# Patient Record
Sex: Female | Born: 1985 | Race: Black or African American | Hispanic: No | Marital: Single | Smoking: Current every day smoker
Health system: Southern US, Community
[De-identification: ages and names within clinical notes are randomized; demographics above are authoritative.]

## PROBLEM LIST (undated history)

## (undated) DIAGNOSIS — O009 Unspecified ectopic pregnancy without intrauterine pregnancy: Secondary | ICD-10-CM

## (undated) HISTORY — PX: NO PAST SURGERIES: SHX2092

## (undated) HISTORY — PX: ECTOPIC PREGNANCY SURGERY: SHX613

---
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
## 2005-03-07 ENCOUNTER — Ambulatory Visit: Payer: Self-pay | Admitting: Nurse Practitioner

## 2005-05-09 ENCOUNTER — Emergency Department (HOSPITAL_COMMUNITY): Admission: EM | Admit: 2005-05-09 | Discharge: 2005-05-09 | Payer: Self-pay | Admitting: Emergency Medicine

## 2005-05-22 ENCOUNTER — Ambulatory Visit: Payer: Self-pay | Admitting: Nurse Practitioner

## 2005-06-29 ENCOUNTER — Emergency Department (HOSPITAL_COMMUNITY): Admission: EM | Admit: 2005-06-29 | Discharge: 2005-06-29 | Payer: Self-pay | Admitting: Emergency Medicine

## 2006-06-18 ENCOUNTER — Emergency Department (HOSPITAL_COMMUNITY): Admission: EM | Admit: 2006-06-18 | Discharge: 2006-06-18 | Payer: Self-pay | Admitting: Emergency Medicine

## 2006-09-11 DIAGNOSIS — D649 Anemia, unspecified: Secondary | ICD-10-CM | POA: Insufficient documentation

## 2006-09-11 DIAGNOSIS — J45909 Unspecified asthma, uncomplicated: Secondary | ICD-10-CM | POA: Insufficient documentation

## 2006-09-11 DIAGNOSIS — D509 Iron deficiency anemia, unspecified: Secondary | ICD-10-CM | POA: Insufficient documentation

## 2007-08-19 ENCOUNTER — Emergency Department (HOSPITAL_COMMUNITY): Admission: EM | Admit: 2007-08-19 | Discharge: 2007-08-19 | Payer: Self-pay | Admitting: Emergency Medicine

## 2007-08-22 ENCOUNTER — Emergency Department (HOSPITAL_COMMUNITY): Admission: EM | Admit: 2007-08-22 | Discharge: 2007-08-22 | Payer: Self-pay | Admitting: Emergency Medicine

## 2007-09-19 ENCOUNTER — Inpatient Hospital Stay (HOSPITAL_COMMUNITY): Admission: AD | Admit: 2007-09-19 | Discharge: 2007-09-19 | Payer: Self-pay | Admitting: Obstetrics & Gynecology

## 2008-04-25 ENCOUNTER — Inpatient Hospital Stay (HOSPITAL_COMMUNITY): Admission: AD | Admit: 2008-04-25 | Discharge: 2008-04-27 | Payer: Self-pay | Admitting: Obstetrics

## 2009-04-08 ENCOUNTER — Emergency Department (HOSPITAL_COMMUNITY): Admission: EM | Admit: 2009-04-08 | Discharge: 2009-04-08 | Payer: Self-pay | Admitting: Emergency Medicine

## 2009-11-08 ENCOUNTER — Emergency Department (HOSPITAL_COMMUNITY): Admission: EM | Admit: 2009-11-08 | Discharge: 2009-11-08 | Payer: Self-pay | Admitting: Emergency Medicine

## 2010-02-13 ENCOUNTER — Encounter: Payer: Self-pay | Admitting: Obstetrics & Gynecology

## 2010-04-17 LAB — URINE MICROSCOPIC-ADD ON

## 2010-04-17 LAB — URINE CULTURE

## 2010-04-17 LAB — URINALYSIS, ROUTINE W REFLEX MICROSCOPIC
Glucose, UA: NEGATIVE mg/dL
Hgb urine dipstick: NEGATIVE
Ketones, ur: 15 mg/dL — AB
Nitrite: NEGATIVE
Protein, ur: NEGATIVE mg/dL
Specific Gravity, Urine: 1.024 (ref 1.005–1.030)
Urobilinogen, UA: 1 mg/dL (ref 0.0–1.0)
pH: 6.5 (ref 5.0–8.0)

## 2010-04-17 LAB — HEMOCCULT GUIAC POC 1CARD (OFFICE): Fecal Occult Bld: POSITIVE

## 2010-04-17 LAB — POCT PREGNANCY, URINE: Preg Test, Ur: NEGATIVE

## 2010-05-04 LAB — CBC
HCT: 25.8 % — ABNORMAL LOW (ref 36.0–46.0)
HCT: 30.6 % — ABNORMAL LOW (ref 36.0–46.0)
Hemoglobin: 8.3 g/dL — ABNORMAL LOW (ref 12.0–15.0)
Hemoglobin: 9.8 g/dL — ABNORMAL LOW (ref 12.0–15.0)
MCHC: 32 g/dL (ref 30.0–36.0)
MCHC: 32.1 g/dL (ref 30.0–36.0)
MCV: 79.1 fL (ref 78.0–100.0)
MCV: 79.8 fL (ref 78.0–100.0)
Platelets: 214 10*3/uL (ref 150–400)
Platelets: 255 10*3/uL (ref 150–400)
RBC: 3.26 MIL/uL — ABNORMAL LOW (ref 3.87–5.11)
RBC: 3.84 MIL/uL — ABNORMAL LOW (ref 3.87–5.11)
RDW: 13.4 % (ref 11.5–15.5)
RDW: 14 % (ref 11.5–15.5)
WBC: 11.9 10*3/uL — ABNORMAL HIGH (ref 4.0–10.5)
WBC: 9.6 10*3/uL (ref 4.0–10.5)

## 2010-05-04 LAB — RPR: RPR Ser Ql: NONREACTIVE

## 2010-10-21 LAB — URINALYSIS, ROUTINE W REFLEX MICROSCOPIC
Bilirubin Urine: NEGATIVE
Glucose, UA: NEGATIVE
Hgb urine dipstick: NEGATIVE
Ketones, ur: NEGATIVE
Nitrite: NEGATIVE
Protein, ur: NEGATIVE
Specific Gravity, Urine: 1.023
Urobilinogen, UA: 0.2
pH: 6.5

## 2010-10-21 LAB — RPR: RPR Ser Ql: NONREACTIVE

## 2010-10-21 LAB — WET PREP, GENITAL
Clue Cells Wet Prep HPF POC: NONE SEEN
Trich, Wet Prep: NONE SEEN
WBC, Wet Prep HPF POC: NONE SEEN

## 2010-10-21 LAB — HCG, QUANTITATIVE, PREGNANCY
hCG, Beta Chain, Quant, S: 1451 — ABNORMAL HIGH
hCG, Beta Chain, Quant, S: 4637 — ABNORMAL HIGH

## 2010-10-21 LAB — GC/CHLAMYDIA PROBE AMP, GENITAL
Chlamydia, DNA Probe: NEGATIVE
GC Probe Amp, Genital: NEGATIVE

## 2010-10-21 LAB — POCT PREGNANCY, URINE
Operator id: 272551
Preg Test, Ur: POSITIVE

## 2011-04-11 ENCOUNTER — Encounter (HOSPITAL_COMMUNITY): Payer: Self-pay | Admitting: *Deleted

## 2011-04-11 ENCOUNTER — Emergency Department (INDEPENDENT_AMBULATORY_CARE_PROVIDER_SITE_OTHER)
Admission: EM | Admit: 2011-04-11 | Discharge: 2011-04-11 | Disposition: A | Discharge status: Nursing Home (Includes ICF) | Payer: Self-pay | Source: Home / Self Care | Attending: Family Medicine | Admitting: Family Medicine

## 2011-04-11 DIAGNOSIS — N949 Unspecified condition associated with female genital organs and menstrual cycle: Secondary | ICD-10-CM

## 2011-04-11 DIAGNOSIS — N926 Irregular menstruation, unspecified: Secondary | ICD-10-CM

## 2011-04-11 DIAGNOSIS — N938 Other specified abnormal uterine and vaginal bleeding: Secondary | ICD-10-CM

## 2011-04-11 LAB — POCT PREGNANCY, URINE: Preg Test, Ur: NEGATIVE

## 2011-04-11 NOTE — ED Provider Notes (Signed)
History     CSN: [id number]  Arrival date & time 04/11/11  0848   First MD Initiated Contact with Patient 04/11/11 [phone]      Chief Complaint  Patient presents with  . Vaginal Bleeding    (Consider location/radiation/quality/duration/timing/severity/associated sxs/prior treatment) Patient is a 26 y.o. female presenting with vaginal bleeding. The history is provided by the patient.  Vaginal Bleeding This is a new problem. The current episode started more than 1 week ago (heavy vag bleeding over past 3-4 weeks, no h/o irreg bleeding, now changing tampons q2h., having cramping like menses.). The problem occurs constantly. The problem has been gradually worsening.    Past Medical History  Diagnosis Date  . Asthma     History reviewed. No pertinent past surgical history.  Family History  Problem Relation Age of Onset  . Heart murmur Mother   . Mental illness Mother   . Heart murmur Brother     History  Substance Use Topics  . Smoking status: Current Everyday Smoker -- 0.5 packs/day for 5 years    Types: Cigarettes  . Smokeless tobacco: Not on file  . Alcohol Use: No    OB History    Grav Para Term Preterm Abortions TAB SAB Ect Mult Living                  Review of Systems  Constitutional: Negative.   Gastrointestinal: Negative.   Genitourinary: Positive for vaginal bleeding and pelvic pain. Negative for dysuria, frequency and vaginal discharge.    Allergies  Review of patient's allergies indicates no known allergies.  Home Medications  No current outpatient prescriptions on file.  BP 108/66  Pulse 66  Temp(Src) 98.1 F (36.7 C) (Oral)  Resp 16  SpO2 100%  LMP 04/08/2011  Physical Exam  Nursing note and vitals reviewed. Constitutional: She is oriented to person, place, and time. She appears well-developed and well-nourished.  Abdominal: Soft. Bowel sounds are normal. She exhibits no distension and no mass. There is tenderness in the suprapubic area.  There is no rigidity, no rebound and no guarding.  Neurological: She is alert and oriented to person, place, and time.  Skin: Skin is warm and dry.    ED Course  Procedures (including critical care time)   Labs Reviewed  POCT PREGNANCY, URINE   No results found.   1. Dysfunctional uterine bleeding       MDM  [id number]-neg        Linna Hoff, MD 04/11/11 1038

## 2011-04-11 NOTE — ED Notes (Signed)
Pt reports about 3 weeks ago she was having vaginal itching and discharge.  She used OTC Monistat 1 with good results.  A week later she had a heavy, 10 day period with cramping.  5  days later she started having heavy vaginal bleeding again, which was 3 days ago   She took an OTC pregnancy test recently and it was negative

## 2011-04-11 NOTE — ED Notes (Signed)
Rosalita Chessman, NP at [facility] called and informed pt is on her way there for further care

## 2011-04-11 NOTE — Discharge Instructions (Signed)
Go directly to [facility] for further eval of vaginal bleeding.

## 2011-04-12 ENCOUNTER — Inpatient Hospital Stay (HOSPITAL_COMMUNITY)
Admission: AD | Admit: 2011-04-12 | Discharge: 2011-04-12 | Disposition: A | Discharge status: Home / Self Care | Payer: Self-pay | Source: Ambulatory Visit | Attending: Obstetrics | Admitting: Obstetrics

## 2011-04-12 ENCOUNTER — Encounter (HOSPITAL_COMMUNITY): Payer: Self-pay | Admitting: *Deleted

## 2011-04-12 DIAGNOSIS — N949 Unspecified condition associated with female genital organs and menstrual cycle: Secondary | ICD-10-CM | POA: Insufficient documentation

## 2011-04-12 DIAGNOSIS — N938 Other specified abnormal uterine and vaginal bleeding: Secondary | ICD-10-CM | POA: Insufficient documentation

## 2011-04-12 LAB — URINALYSIS, ROUTINE W REFLEX MICROSCOPIC
Glucose, UA: NEGATIVE mg/dL
Leukocytes, UA: NEGATIVE
Protein, ur: NEGATIVE mg/dL
pH: 7 (ref 5.0–8.0)

## 2011-04-12 LAB — CBC
HCT: 28.5 % — ABNORMAL LOW (ref 36.0–46.0)
Hemoglobin: 9.1 g/dL — ABNORMAL LOW (ref 12.0–15.0)
MCHC: 31.9 g/dL (ref 30.0–36.0)
RDW: 14 % (ref 11.5–15.5)
WBC: 4.1 10*3/uL (ref 4.0–10.5)

## 2011-04-12 LAB — WET PREP, GENITAL: Clue Cells Wet Prep HPF POC: NONE SEEN

## 2011-04-12 MED ORDER — POLYETHYLENE GLYCOL 3350 17 GM/SCOOP PO POWD: 17.0000 g | Freq: Every day | ORAL | Status: AC

## 2011-04-12 MED ORDER — MEGESTROL ACETATE 40 MG PO TABS: 40.0000 mg | ORAL_TABLET | Freq: Every day | ORAL | Status: AC

## 2011-04-12 NOTE — MAU Note (Signed)
Tampon removed after triage/ medium bleeding, pad placed January menses x 5 days.

## 2011-04-12 NOTE — Discharge Instructions (Signed)
Menorrhagia   Menorrhagia is when a menstrual period is heavier or longer than normal.  HOME CARE   Only take medicine as told by your doctor.   Do not take aspirin 1 week before or during your period. Aspirin can make the bleeding worse.   Lay down for a while if you change your tampon or pad more than once in 2 hours. This may help lessen the bleeding.   Take any iron pills as told by your doctor. Heavy bleeding may cause you to lack iron in your body.   Eat a healthy diet and foods with iron. These foods include leafy green vegetables, meat, liver, eggs, and whole grain breads and cereals.   Eat foods that are high in vitamin C. These include oranges, orange juice, and grapefruits. Vitamin C can help your body take in more iron.   Do not try to lose weight. Wait until the heavy bleeding has stopped and your iron level is normal.  GET HELP RIGHT AWAY IF:   You get a fever.   You have trouble breathing.   You bleed even more heavily than usual and pass blood clots.   You feel dizzy, weak, or pass out (faint).   You need to change your tampon or pad more than once an hour.   You feel sick to your stomach (nauseous), throw up (vomit), or have watery poop (diarrhea).   You have problems from medicine.  MAKE SURE YOU:    Understand these instructions.   Will watch your condition.   Will get help right away if you are not doing well or get worse.  Document Released: 10/19/2007 Document Revised: 12/29/2010 Document Reviewed: 10/19/2007  [employer] Patient Information 2012 [employer].

## 2011-04-12 NOTE — MAU Provider Note (Signed)
  History     CSN: [id number]  Arrival date and time: 04/12/11 0846   None     Chief Complaint  Patient presents with  . Vaginal Bleeding   Vaginal Bleeding The patient's primary symptoms include genital itching and vaginal bleeding. The current episode started more than 1 month ago. The problem occurs constantly. The problem has been unchanged. The pain is mild. She is not pregnant. Associated symptoms include constipation. Pertinent negatives include no back pain, dysuria, fever, headaches or rash. Associated symptoms comments: Constipation, vaginal itching. . The vaginal bleeding is heavier than menses. She has not been passing clots. She has not been passing tissue. The symptoms are aggravated by nothing. She has tried nothing for the symptoms. She is sexually active. No, her partner does not have an STD. She uses nothing for contraception. Her menstrual history has been regular.    OB History    Grav Para Term Preterm Abortions TAB SAB Ect Mult Living                   Past Medical History  Diagnosis Date  . Asthma     Past Surgical History  Procedure Date  . No past surgeries     Family History  Problem Relation Age of Onset  . Heart murmur Mother   . Mental illness Mother   . Heart murmur Brother     History  Substance Use Topics  . Smoking status: Current Everyday Smoker -- 0.5 packs/day for 5 years    Types: Cigarettes  . Smokeless tobacco: Not on file  . Alcohol Use: No    Allergies:  Allergies  Allergen Reactions  . Latex Hives    No prescriptions prior to admission    Review of Systems  Constitutional: Negative for fever.  Eyes: Negative for blurred vision.  Respiratory: Negative for shortness of breath.   Cardiovascular: Negative for chest pain.  Gastrointestinal: Positive for constipation.  Genitourinary: Positive for vaginal bleeding. Negative for dysuria.  Musculoskeletal: Negative for back pain.  Skin: Negative for rash.  Neurological:  Negative for dizziness, weakness and headaches.   Physical Exam   Blood pressure 128/75, pulse 62, temperature 97.2 F (36.2 C), resp. rate 18, height 5\' 7"  (1.702 m), weight 92.987 kg (205 lb), last menstrual period 03/21/2011.  Physical Exam  Constitutional: She appears well-developed and well-nourished. No distress.  HENT:  Head: Normocephalic and atraumatic.  Eyes: EOM are normal. Pupils are equal, round, and reactive to light.  Neck: Neck supple.  Cardiovascular: Normal rate, regular rhythm and normal heart sounds.   Respiratory: Effort normal and breath sounds normal.  GI: Soft. Bowel sounds are normal. There is no tenderness.  Genitourinary: Vagina normal and uterus normal. Right adnexum displays no mass. Left adnexum displays no mass.       Blood in os and vaginal vault.     MAU Course  Procedures  MDM Patient with bleeding x 1 month, will check for GC/Chlamydia.  Wet prep negative, hemoglobin at baseline.    Assessment and Plan  26 y.o. female who presents with DUB  Gail Weber 04/12/2011, 10:08 AM

## 2011-04-12 NOTE — MAU Note (Addendum)
Bleeding started 2/26/ 13, stopped 1 week ago, then started again after 2 days. Tampons 1 per hour since 4 days ago. No birth control pills,  Del 2010, no birth control since, had NUVA ring after this child, she does not remember removing 2nd cycle, Dr. Gaynell Face examed her and did not find.Normal period in January Negative UPT yesterday at Urgent Care

## 2011-04-13 LAB — GC/CHLAMYDIA PROBE AMP, GENITAL: Chlamydia, DNA Probe: NEGATIVE

## 2011-12-11 ENCOUNTER — Emergency Department (HOSPITAL_COMMUNITY): Payer: Self-pay

## 2011-12-11 ENCOUNTER — Encounter (HOSPITAL_COMMUNITY): Payer: Self-pay | Admitting: Emergency Medicine

## 2011-12-11 ENCOUNTER — Emergency Department (HOSPITAL_COMMUNITY)
Admission: EM | Admit: 2011-12-11 | Discharge: 2011-12-11 | Disposition: A | Discharge status: Home / Self Care | Payer: Self-pay | Attending: Emergency Medicine | Admitting: Emergency Medicine

## 2011-12-11 DIAGNOSIS — F17200 Nicotine dependence, unspecified, uncomplicated: Secondary | ICD-10-CM | POA: Insufficient documentation

## 2011-12-11 DIAGNOSIS — Y929 Unspecified place or not applicable: Secondary | ICD-10-CM | POA: Insufficient documentation

## 2011-12-11 DIAGNOSIS — S40012A Contusion of left shoulder, initial encounter: Secondary | ICD-10-CM

## 2011-12-11 DIAGNOSIS — IMO0002 Reserved for concepts with insufficient information to code with codable children: Secondary | ICD-10-CM | POA: Insufficient documentation

## 2011-12-11 DIAGNOSIS — J45909 Unspecified asthma, uncomplicated: Secondary | ICD-10-CM | POA: Insufficient documentation

## 2011-12-11 DIAGNOSIS — S40019A Contusion of unspecified shoulder, initial encounter: Secondary | ICD-10-CM | POA: Insufficient documentation

## 2011-12-11 DIAGNOSIS — Y939 Activity, unspecified: Secondary | ICD-10-CM | POA: Insufficient documentation

## 2011-12-11 MED ORDER — IBUPROFEN 400 MG PO TABS
600.0000 mg | ORAL_TABLET | Freq: Once | ORAL | Status: DC
  Filled 2011-12-11: qty 1

## 2011-12-11 MED ORDER — TRAMADOL HCL 50 MG PO TABS: 50.0000 mg | ORAL_TABLET | Freq: Four times a day (QID) | ORAL | Status: DC | PRN

## 2011-12-11 MED ORDER — TRAMADOL HCL 50 MG PO TABS
50.0000 mg | ORAL_TABLET | Freq: Once | ORAL | Status: AC
  Administered 2011-12-11: 50 mg via ORAL
  Filled 2011-12-11: qty 1

## 2011-12-11 NOTE — Progress Notes (Signed)
Orthopedic Tech Progress Note Patient Details:  Gail Weber [birth year] [id number]  Ortho Devices Type of Ortho Device: Arm foam sling Ortho Device/Splint Location: (L) UE Ortho Device/Splint Interventions: Application   Jennye Moccasin 12/11/2011, 5:20 PM
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
## 2011-12-11 NOTE — ED Notes (Signed)
NAD noted at time of d/c home 
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
## 2012-02-05 ENCOUNTER — Emergency Department (HOSPITAL_COMMUNITY)
Admission: EM | Admit: 2012-02-05 | Discharge: 2012-02-06 | Disposition: A | Discharge status: Home / Self Care | Payer: Self-pay | Attending: Emergency Medicine | Admitting: Emergency Medicine

## 2012-02-05 DIAGNOSIS — J45909 Unspecified asthma, uncomplicated: Secondary | ICD-10-CM | POA: Insufficient documentation

## 2012-02-05 DIAGNOSIS — Z79899 Other long term (current) drug therapy: Secondary | ICD-10-CM | POA: Insufficient documentation

## 2012-02-05 DIAGNOSIS — F17200 Nicotine dependence, unspecified, uncomplicated: Secondary | ICD-10-CM | POA: Insufficient documentation

## 2012-02-05 DIAGNOSIS — R197 Diarrhea, unspecified: Secondary | ICD-10-CM

## 2012-02-05 DIAGNOSIS — R112 Nausea with vomiting, unspecified: Secondary | ICD-10-CM

## 2012-02-05 DIAGNOSIS — K529 Noninfective gastroenteritis and colitis, unspecified: Secondary | ICD-10-CM

## 2012-02-05 DIAGNOSIS — K5289 Other specified noninfective gastroenteritis and colitis: Secondary | ICD-10-CM | POA: Insufficient documentation

## 2012-02-05 DIAGNOSIS — Z3202 Encounter for pregnancy test, result negative: Secondary | ICD-10-CM | POA: Insufficient documentation

## 2012-02-05 MED ORDER — ONDANSETRON HCL 4 MG/2ML IJ SOLN
4.0000 mg | Freq: Once | INTRAMUSCULAR | Status: AC
  Administered 2012-02-05: 4 mg via INTRAVENOUS
  Filled 2012-02-05: qty 2

## 2012-02-05 MED ORDER — SODIUM CHLORIDE 0.9 % IV SOLN
Freq: Once | INTRAVENOUS | Status: AC
  Administered 2012-02-05: 23:00:00 via INTRAVENOUS

## 2012-02-05 NOTE — ED Notes (Signed)
Pt was given Zofran IV for c/o nausea and vomiting  Sxs improved  Pt no longer nauseated and states pain is gone

## 2012-02-05 NOTE — ED Notes (Signed)
Per EMS pt is having abd pain with nausea, vomiting, and diarrhea  Sxs started today  Pt lives with someone who has recently been treated for same

## 2012-02-06 LAB — URINALYSIS, ROUTINE W REFLEX MICROSCOPIC
Glucose, UA: NEGATIVE mg/dL
Leukocytes, UA: NEGATIVE
Protein, ur: NEGATIVE mg/dL
Specific Gravity, Urine: 1.025 (ref 1.005–1.030)
Urobilinogen, UA: 0.2 mg/dL (ref 0.0–1.0)

## 2012-02-06 LAB — PREGNANCY, URINE: Preg Test, Ur: NEGATIVE

## 2012-02-06 LAB — URINE MICROSCOPIC-ADD ON

## 2012-02-06 MED ORDER — SODIUM CHLORIDE 0.9 % IV BOLUS (SEPSIS)
1000.0000 mL | Freq: Once | INTRAVENOUS | Status: AC
  Administered 2012-02-06: 1000 mL via INTRAVENOUS

## 2012-02-06 MED ORDER — ONDANSETRON HCL 4 MG PO TABS: 4.0000 mg | ORAL_TABLET | Freq: Four times a day (QID) | ORAL | Status: DC

## 2012-02-06 NOTE — ED Provider Notes (Signed)
History     CSN: [id number]  Arrival date & time 02/05/12  2157   First MD Initiated Contact with Patient 02/05/12 2307      Chief Complaint  Patient presents with  . Abdominal Pain  . Emesis    (Consider location/radiation/quality/duration/timing/severity/associated sxs/prior treatment) HPI 27 year old female presents to emergency room with complaint of nausea vomiting and diarrhea starting today around 5 PM. She has had multiple episodes of vomiting. Patient reports her nephew was in her house yesterday with similar symptoms. She has had no fever. There's been no blood or mucus in her emesis or stool. She complains of diffuse abdominal pain secondary to vomiting.  Past Medical History  Diagnosis Date  . Asthma     Past Surgical History  Procedure Date  . No past surgeries     Family History  Problem Relation Age of Onset  . Heart murmur Mother   . Mental illness Mother   . Heart murmur Brother     History  Substance Use Topics  . Smoking status: Current Every Day Smoker -- 0.5 packs/day for 5 years    Types: Cigarettes  . Smokeless tobacco: Not on file  . Alcohol Use: No    OB History    Grav Para Term Preterm Abortions TAB SAB Ect Mult Living                  Review of Systems  See History of Present Illness; otherwise all other systems are reviewed and negative Allergies  Latex  Home Medications   Current Outpatient Rx  Name  Route  Sig  Dispense  Refill  . ALBUTEROL SULFATE HFA 108 (90 BASE) MCG/ACT IN AERS   Inhalation   Inhale 2 puffs into the lungs every 6 (six) hours as needed. For shortness of breath.         . TRAMADOL HCL 50 MG PO TABS   Oral   Take 1 tablet (50 mg total) by mouth every 6 (six) hours as needed for pain.   15 tablet   0     BP 128/59  Pulse 90  Temp 98.7 F (37.1 C) (Oral)  Resp 18  SpO2 100%  Physical Exam  Nursing note and vitals reviewed. Constitutional: She is oriented to person, place, and time. She  appears well-developed and well-nourished.  HENT:  Head: Normocephalic and atraumatic.  Nose: Nose normal.  Mouth/Throat: No oropharyngeal exudate.       Dry mucous membranes  Eyes: Conjunctivae normal and EOM are normal. Pupils are equal, round, and reactive to light.  Neck: Normal range of motion. Neck supple. No JVD present. No tracheal deviation present. No thyromegaly present.  Cardiovascular: Normal rate, regular rhythm, normal heart sounds and intact distal pulses.  Exam reveals no gallop and no friction rub.   No murmur heard. Pulmonary/Chest: Effort normal and breath sounds normal. No stridor. No respiratory distress. She has no wheezes. She has no rales. She exhibits no tenderness.  Abdominal: Soft. She exhibits no distension and no mass. There is tenderness. There is no rebound and no guarding.       Diffuse of bowel pain, no rebound or guarding. Hyperactive bowel sounds  Musculoskeletal: Normal range of motion. She exhibits no edema and no tenderness.  Lymphadenopathy:    She has no cervical adenopathy.  Neurological: She is alert and oriented to person, place, and time. She exhibits normal muscle tone. Coordination normal.  Skin: Skin is warm and dry. No rash noted.  No erythema. No pallor.  Psychiatric: She has a normal mood and affect. Her behavior is normal. Judgment and thought content normal.    ED Course  Procedures (including critical care time)  Labs Reviewed  URINALYSIS, ROUTINE W REFLEX MICROSCOPIC - Abnormal; Notable for the following:    Hgb urine dipstick SMALL (*)     Ketones, ur >80 (*)     All other components within normal limits  URINE MICROSCOPIC-ADD ON - Abnormal; Notable for the following:    Squamous Epithelial / LPF MANY (*)     All other components within normal limits  PREGNANCY, URINE   No results found.   1. Gastroenteritis   2. Nausea vomiting and diarrhea       MDM  27 year old female with nausea vomiting diarrhea. Suspect viral  gastroenteritis. She has tolerated fluids and crackers.. Will send her home with Zofran          Olivia Mackie, MD 02/06/12 [phone]

## 2012-04-17 ENCOUNTER — Encounter (HOSPITAL_COMMUNITY): Payer: Self-pay | Admitting: Emergency Medicine

## 2012-04-17 ENCOUNTER — Emergency Department (HOSPITAL_COMMUNITY)
Admission: EM | Admit: 2012-04-17 | Discharge: 2012-04-17 | Disposition: A | Discharge status: Home / Self Care | Payer: Self-pay | Attending: Emergency Medicine | Admitting: Emergency Medicine

## 2012-04-17 DIAGNOSIS — K649 Unspecified hemorrhoids: Secondary | ICD-10-CM | POA: Insufficient documentation

## 2012-04-17 DIAGNOSIS — N760 Acute vaginitis: Secondary | ICD-10-CM | POA: Insufficient documentation

## 2012-04-17 DIAGNOSIS — B9689 Other specified bacterial agents as the cause of diseases classified elsewhere: Secondary | ICD-10-CM

## 2012-04-17 DIAGNOSIS — N898 Other specified noninflammatory disorders of vagina: Secondary | ICD-10-CM | POA: Insufficient documentation

## 2012-04-17 DIAGNOSIS — F17200 Nicotine dependence, unspecified, uncomplicated: Secondary | ICD-10-CM | POA: Insufficient documentation

## 2012-04-17 DIAGNOSIS — N949 Unspecified condition associated with female genital organs and menstrual cycle: Secondary | ICD-10-CM | POA: Insufficient documentation

## 2012-04-17 DIAGNOSIS — L299 Pruritus, unspecified: Secondary | ICD-10-CM | POA: Insufficient documentation

## 2012-04-17 DIAGNOSIS — A630 Anogenital (venereal) warts: Secondary | ICD-10-CM | POA: Insufficient documentation

## 2012-04-17 DIAGNOSIS — J45909 Unspecified asthma, uncomplicated: Secondary | ICD-10-CM | POA: Insufficient documentation

## 2012-04-17 LAB — WET PREP, GENITAL: Trich, Wet Prep: NONE SEEN

## 2012-04-17 LAB — URINALYSIS, ROUTINE W REFLEX MICROSCOPIC
Bilirubin Urine: NEGATIVE
Glucose, UA: NEGATIVE mg/dL
Hgb urine dipstick: NEGATIVE
Ketones, ur: NEGATIVE mg/dL
Specific Gravity, Urine: 1.032 — ABNORMAL HIGH (ref 1.005–1.030)
pH: 6.5 (ref 5.0–8.0)

## 2012-04-17 LAB — URINE MICROSCOPIC-ADD ON

## 2012-04-17 MED ORDER — AZITHROMYCIN 250 MG PO TABS
1000.0000 mg | ORAL_TABLET | Freq: Once | ORAL | Status: AC
  Administered 2012-04-17: 1000 mg via ORAL
  Filled 2012-04-17: qty 4

## 2012-04-17 MED ORDER — METRONIDAZOLE 500 MG PO TABS: 500.0000 mg | ORAL_TABLET | Freq: Two times a day (BID) | ORAL | Status: DC

## 2012-04-17 MED ORDER — LIDOCAINE HCL 2 % EX GEL: CUTANEOUS | Status: DC | PRN

## 2012-04-17 MED ORDER — CEFTRIAXONE SODIUM 250 MG IJ SOLR
250.0000 mg | Freq: Once | INTRAMUSCULAR | Status: AC
  Administered 2012-04-17: 250 mg via INTRAMUSCULAR
  Filled 2012-04-17: qty 250

## 2012-04-17 NOTE — ED Notes (Signed)
Pt c/o hemorrhoids. Pt states she is not having bleeding from them, but area is painful. Pt also c/o bumps to her perineal area and vaginal discharge. Pt states these symptoms started about 3 days ago. Pt describes discharge as yellow/white with foul odor. Pt arrives with companion.

## 2012-04-17 NOTE — ED Notes (Signed)
PA at bedside.

## 2012-04-17 NOTE — ED Provider Notes (Signed)
History     CSN: [id number]  Arrival date & time 04/17/12  1831   First MD Initiated Contact with Patient 04/17/12 1847      Chief Complaint  Patient presents with  . Vaginal Discharge  . Hemorrhoids    (Consider location/radiation/quality/duration/timing/severity/associated sxs/prior treatment) HPI Comments: Pt presents to the ED for white/yellow, malodorous, vaginal discharge x 3 days.  Pt also notes some vaginal lesions and hemorrhoids which have progressed over the past week and are now painful and itchy.  Pt denies any abdominal pain, nausea, vomiting, vaginal bleeding, dysuria, diarrhea, hematochezia.  Prior hx of hemorrhoids during pregnancy.  No prior hx of STD.  New sexual partner within the last month.  The history is provided by the patient.    Past Medical History  Diagnosis Date  . Asthma     Past Surgical History  Procedure Laterality Date  . No past surgeries      Family History  Problem Relation Age of Onset  . Heart murmur Mother   . Mental illness Mother   . Heart murmur Brother     History  Substance Use Topics  . Smoking status: Current Every Day Smoker -- 0.50 packs/day for 5 years    Types: Cigarettes  . Smokeless tobacco: Not on file  . Alcohol Use: No    OB History   Grav Para Term Preterm Abortions TAB SAB Ect Mult Living                  Review of Systems  Genitourinary: Positive for vaginal discharge and genital sores.  All other systems reviewed and are negative.    Allergies  Latex  Home Medications   Current Outpatient Rx  Name  Route  Sig  Dispense  Refill  . albuterol (PROVENTIL HFA;VENTOLIN HFA) 108 (90 BASE) MCG/ACT inhaler   Inhalation   Inhale 2 puffs into the lungs every 6 (six) hours as needed. For shortness of breath.         . Pseudoephedrine-APAP-DM (DAYQUIL MULTI-SYMPTOM COLD/FLU PO)   Oral   Take 1 tablet by mouth as needed. Cold sympt           BP 107/64  Pulse 83  Temp(Src) 99 F (37.2 C)  (Oral)  SpO2 100%  LMP 04/03/2012  Physical Exam  Nursing note and vitals reviewed. Constitutional: She is oriented to person, place, and time. She appears well-developed and well-nourished.  HENT:  Head: Normocephalic and atraumatic.  Mouth/Throat: Oropharynx is clear and moist.  Eyes: Conjunctivae and EOM are normal. Pupils are equal, round, and reactive to light.  Neck: Normal range of motion.  Cardiovascular: Normal rate, regular rhythm and normal heart sounds.   Pulmonary/Chest: Effort normal and breath sounds normal.  Abdominal: Soft. Bowel sounds are normal.  Genitourinary: Rectal exam shows external hemorrhoid. Rectal exam shows no fissure. Cervix exhibits discharge. Cervix exhibits no motion tenderness. Right adnexum displays no tenderness. Left adnexum displays no tenderness. No erythema, tenderness or bleeding around the vagina. Vaginal discharge (purulent) found.  Purulent vaginal d/c present, genital lesions consistent with warts present on both labia, and surrounding the introitus; non-thrombosed, non-bleeding external hemorrhoids  Musculoskeletal: Normal range of motion.  Neurological: She is alert and oriented to person, place, and time.  Skin: Skin is warm and dry.  Psychiatric: She has a normal mood and affect.    ED Course  Procedures (including critical care time)  Labs Reviewed  WET PREP, GENITAL - Abnormal; Notable for the following:  Yeast Wet Prep HPF POC RARE (*)    Clue Cells Wet Prep HPF POC MANY (*)    WBC, Wet Prep HPF POC MODERATE (*)    All other components within normal limits  URINALYSIS, ROUTINE W REFLEX MICROSCOPIC - Abnormal; Notable for the following:    APPearance CLOUDY (*)    Specific Gravity, Urine 1.032 (*)    Urobilinogen, UA 2.0 (*)    Leukocytes, UA SMALL (*)    All other components within normal limits  URINE MICROSCOPIC-ADD ON - Abnormal; Notable for the following:    Squamous Epithelial / LPF FEW (*)    Bacteria, UA FEW (*)     All other components within normal limits  GC/CHLAMYDIA PROBE AMP  URINE CULTURE   No results found.   1. BV (bacterial vaginosis)   2. Genital warts   3. Hemorrhoids       MDM   Pt presenting to the ED for new vaginal discharge x 3 days, and increasingly painful genital lesions and hemorrhoids.  New sexual partner within the last month.  No hx of STD.  Prior hemorrhoids during pregnancy.  Pelvic exam revealed lesions on both labia and surrounding the introitus consistent with genital warts.  Lesions are non-ulcerated, non tender, but are pruritic.  Pt tx for GC/CHL in the ED.  Wet prep + for clue cells- will give rx flagyl 7d. Urine culture pending.  Lidocaine jelly PRN for hemorrhoids.  Encouraged FU with [facility] outpatient center for further GYN testing, notably HPV, and treatment.  Return precautions advised.        Garlon Hatchet, PA-C 04/18/12 1017

## 2012-04-17 NOTE — ED Notes (Signed)
Pt given water for PO challenge 

## 2012-04-18 LAB — URINE CULTURE: Colony Count: 15000

## 2012-04-18 LAB — GC/CHLAMYDIA PROBE AMP: GC Probe RNA: NEGATIVE

## 2012-04-18 NOTE — ED Provider Notes (Signed)
Medical screening examination/treatment/procedure(s) were performed by non-physician practitioner and as supervising physician I was immediately available for consultation/collaboration. Devoria Albe, MD, Armando Gang   Ward Givens, MD 04/18/12 2030

## 2015-05-22 ENCOUNTER — Emergency Department (HOSPITAL_COMMUNITY)
Admission: EM | Admit: 2015-05-22 | Discharge: 2015-05-22 | Disposition: A | Discharge status: Home / Self Care | Payer: Self-pay | Attending: Emergency Medicine | Admitting: Emergency Medicine

## 2015-05-22 ENCOUNTER — Encounter (HOSPITAL_COMMUNITY): Payer: Self-pay | Admitting: *Deleted

## 2015-05-22 DIAGNOSIS — Z79899 Other long term (current) drug therapy: Secondary | ICD-10-CM | POA: Insufficient documentation

## 2015-05-22 DIAGNOSIS — Y998 Other external cause status: Secondary | ICD-10-CM | POA: Insufficient documentation

## 2015-05-22 DIAGNOSIS — Y9301 Activity, walking, marching and hiking: Secondary | ICD-10-CM | POA: Insufficient documentation

## 2015-05-22 DIAGNOSIS — W540XXA Bitten by dog, initial encounter: Secondary | ICD-10-CM | POA: Insufficient documentation

## 2015-05-22 DIAGNOSIS — Y9289 Other specified places as the place of occurrence of the external cause: Secondary | ICD-10-CM | POA: Insufficient documentation

## 2015-05-22 DIAGNOSIS — S70311A Abrasion, right thigh, initial encounter: Secondary | ICD-10-CM | POA: Insufficient documentation

## 2015-05-22 DIAGNOSIS — J45909 Unspecified asthma, uncomplicated: Secondary | ICD-10-CM | POA: Insufficient documentation

## 2015-05-22 DIAGNOSIS — T148XXA Other injury of unspecified body region, initial encounter: Secondary | ICD-10-CM

## 2015-05-22 DIAGNOSIS — Z91040 Latex allergy status: Secondary | ICD-10-CM | POA: Insufficient documentation

## 2015-05-22 DIAGNOSIS — F17210 Nicotine dependence, cigarettes, uncomplicated: Secondary | ICD-10-CM | POA: Insufficient documentation

## 2015-05-22 DIAGNOSIS — S71151A Open bite, right thigh, initial encounter: Secondary | ICD-10-CM | POA: Insufficient documentation

## 2015-05-22 DIAGNOSIS — Z792 Long term (current) use of antibiotics: Secondary | ICD-10-CM | POA: Insufficient documentation

## 2015-05-22 NOTE — ED Notes (Signed)
Bacitracin ointment and bandage applied to area.

## 2015-05-22 NOTE — ED Notes (Signed)
Pt verbalized understanding of d/c instructions and has no further questions. Pt stable and NAD.  

## 2015-05-22 NOTE — ED Notes (Signed)
The pt was bitten by her friends dog just pta to her rt upper thigh  She has a bruise to the thigh  But i cannot see a puncture wound  No bleeding  lmp this month

## 2015-05-22 NOTE — Discharge Instructions (Signed)

## 2015-05-22 NOTE — ED Provider Notes (Signed)
CSN: [id number]     Arrival date & time 05/22/15  2115 History  By signing my name below, I, Emmanuella Mensah, attest that this documentation has been prepared under the direction and in the presence of Roxy Horsemanobert Taige Housman, PA-C. Electronically Signed: Angelene GiovanniEmmanuella Mensah, ED Scribe. 05/22/2015. 9:50 PM.    Chief Complaint  Patient presents with  . Animal Bite   The history is provided by the patient. No language interpreter was used.   HPI Comments: Gail Weber is a 30 y.o. female who presents to the Emergency Department for evaluation for a bite mark to her right upper thigh s/p dog bite that occurred PTA. Pt explains that she was bite by the dog unprovoked as she walked by. She adds that the dog's vaccinations are UTD. No alleviating factors noted. Pt has not tried any medications PTA. No fever, chills, n/v, or any open wounds.    Past Medical History  Diagnosis Date  . Asthma    Past Surgical History  Procedure Laterality Date  . No past surgeries     Family History  Problem Relation Age of Onset  . Heart murmur Mother   . Mental illness Mother   . Heart murmur Brother    Social History  Substance Use Topics  . Smoking status: Current Every Day Smoker -- 0.50 packs/day for 5 years    Types: Cigarettes  . Smokeless tobacco: None  . Alcohol Use: No   OB History    No data available     Review of Systems  Constitutional: Negative for fever and chills.  Gastrointestinal: Negative for nausea and vomiting.  Skin: Positive for wound (bite mark).      Allergies  Latex  Home Medications   Prior to Admission medications   Medication Sig Start Date End Date Taking? Authorizing Provider  albuterol (PROVENTIL HFA;VENTOLIN HFA) 108 (90 BASE) MCG/ACT inhaler Inhale 2 puffs into the lungs every 6 (six) hours as needed. For shortness of breath.    Historical Provider, MD  lidocaine (XYLOCAINE JELLY) 2 % jelly Apply topically as needed. 04/17/12   Garlon HatchetLisa M Sanders, PA-C   metroNIDAZOLE (FLAGYL) 500 MG tablet Take 1 tablet (500 mg total) by mouth 2 (two) times daily. 04/17/12   Garlon HatchetLisa M Sanders, PA-C  Pseudoephedrine-APAP-DM (DAYQUIL MULTI-SYMPTOM COLD/FLU PO) Take 1 tablet by mouth as needed. Cold sympt    Historical Provider, MD   BP 115/69 mmHg  Pulse 74  Temp(Src) 98.5 F (36.9 C) (Oral)  Resp 18  Ht 5\' 7"  (1.702 m)  Wt 186 lb 8 oz (84.596 kg)  BMI 29.20 kg/m2  SpO2 98%  LMP 04/26/2015 Physical Exam  Constitutional: She is oriented to person, place, and time. She appears well-developed and well-nourished.  HENT:  Head: Normocephalic and atraumatic.  Eyes: Conjunctivae and EOM are normal.  Neck: Normal range of motion.  Cardiovascular: Normal rate.   Pulmonary/Chest: Effort normal.  Abdominal: She exhibits no distension.  Musculoskeletal: Normal range of motion.  Neurological: She is alert and oriented to person, place, and time.  Skin: Skin is dry.  Very faint abrasion to right upper thigh, no puncture wounds, no lacerations, no open wounds, no bleeding  Psychiatric: She has a normal mood and affect. Her behavior is normal. Judgment and thought content normal.  Nursing note and vitals reviewed.   ED Course  Procedures (including critical care time) DIAGNOSTIC STUDIES: Oxygen Saturation is 98% on RA, normal by my interpretation.    COORDINATION OF CARE: 9:44 PM- Pt advised  of plan for treatment and pt agrees. Will notify animal control to verify dog's rabies vaccination records. Pt will receive dressing with bacitracin ointment.   MDM   Final diagnoses:  Animal bite    Patient was bitten by a domesticated dog this evening. There was no puncture wound or laceration. No open wound or bleeding. There is a mild abrasion to the right upper thigh. The skin is intact. Will apply bacitracin. No antibiotics indicated given that the skin is intact. Notified animal control.  I personally performed the services described in this documentation, which  was scribed in my presence. The recorded information has been reviewed and is accurate.     Roxy Horseman, PA-C 05/22/15 2212  Pricilla Loveless, MD 05/27/15 2101

## 2015-08-11 ENCOUNTER — Inpatient Hospital Stay (HOSPITAL_COMMUNITY): Payer: Self-pay | Admitting: Anesthesiology

## 2015-08-11 ENCOUNTER — Emergency Department (HOSPITAL_COMMUNITY): Payer: Self-pay

## 2015-08-11 ENCOUNTER — Encounter (HOSPITAL_COMMUNITY)
Admission: EM | Disposition: A | Discharge status: Home / Self Care | Payer: Self-pay | Source: Home / Self Care | Attending: Obstetrics & Gynecology

## 2015-08-11 ENCOUNTER — Encounter (HOSPITAL_COMMUNITY): Payer: Self-pay | Admitting: *Deleted

## 2015-08-11 ENCOUNTER — Inpatient Hospital Stay (HOSPITAL_COMMUNITY)
Admission: EM | Admit: 2015-08-11 | Discharge: 2015-08-14 | DRG: 777 | Disposition: A | Discharge status: Home / Self Care | Payer: Self-pay | Attending: Obstetrics & Gynecology | Admitting: Obstetrics & Gynecology

## 2015-08-11 DIAGNOSIS — O001 Tubal pregnancy without intrauterine pregnancy: Principal | ICD-10-CM | POA: Diagnosis present

## 2015-08-11 DIAGNOSIS — O009 Unspecified ectopic pregnancy without intrauterine pregnancy: Secondary | ICD-10-CM | POA: Diagnosis present

## 2015-08-11 DIAGNOSIS — R109 Unspecified abdominal pain: Secondary | ICD-10-CM

## 2015-08-11 DIAGNOSIS — K661 Hemoperitoneum: Secondary | ICD-10-CM | POA: Diagnosis present

## 2015-08-11 DIAGNOSIS — Z91040 Latex allergy status: Secondary | ICD-10-CM

## 2015-08-11 DIAGNOSIS — R52 Pain, unspecified: Secondary | ICD-10-CM

## 2015-08-11 DIAGNOSIS — O99331 Smoking (tobacco) complicating pregnancy, first trimester: Secondary | ICD-10-CM | POA: Diagnosis present

## 2015-08-11 DIAGNOSIS — I959 Hypotension, unspecified: Secondary | ICD-10-CM | POA: Diagnosis present

## 2015-08-11 DIAGNOSIS — D62 Acute posthemorrhagic anemia: Secondary | ICD-10-CM | POA: Diagnosis not present

## 2015-08-11 HISTORY — PX: LAPAROSCOPY: SHX197

## 2015-08-11 HISTORY — PX: LAPAROTOMY: SHX154

## 2015-08-11 LAB — CBC
HCT: 27.8 % — ABNORMAL LOW (ref 36.0–46.0)
HCT: 24.6 % — ABNORMAL LOW (ref 36.0–46.0)
HCT: 21.7 % — ABNORMAL LOW (ref 36.0–46.0)
HEMOGLOBIN: 9.3 g/dL — AB (ref 12.0–15.0)
HEMOGLOBIN: 8 g/dL — AB (ref 12.0–15.0)
Hemoglobin: 7.2 g/dL — ABNORMAL LOW (ref 12.0–15.0)
MCH: 24.2 pg — AB (ref 26.0–34.0)
MCH: 23.5 pg — AB (ref 26.0–34.0)
MCH: 24.1 pg — AB (ref 26.0–34.0)
MCHC: 33.5 g/dL (ref 30.0–36.0)
MCHC: 32.5 g/dL (ref 30.0–36.0)
MCHC: 33.2 g/dL (ref 30.0–36.0)
MCV: 72.2 fL — ABNORMAL LOW (ref 78.0–100.0)
MCV: 72.4 fL — ABNORMAL LOW (ref 78.0–100.0)
MCV: 72.6 fL — ABNORMAL LOW (ref 78.0–100.0)
PLATELETS: 310 10*3/uL (ref 150–400)
PLATELETS: 247 10*3/uL (ref 150–400)
Platelets: 275 10*3/uL (ref 150–400)
RBC: 3.85 MIL/uL — AB (ref 3.87–5.11)
RBC: 3.4 MIL/uL — ABNORMAL LOW (ref 3.87–5.11)
RBC: 2.99 MIL/uL — AB (ref 3.87–5.11)
RDW: 14.2 % (ref 11.5–15.5)
RDW: 14.5 % (ref 11.5–15.5)
RDW: 14.5 % (ref 11.5–15.5)
WBC: 5.1 10*3/uL (ref 4.0–10.5)
WBC: 8.4 10*3/uL (ref 4.0–10.5)
WBC: 10.8 10*3/uL — ABNORMAL HIGH (ref 4.0–10.5)

## 2015-08-11 LAB — BASIC METABOLIC PANEL
ANION GAP: 8 (ref 5–15)
BUN: 20 mg/dL (ref 6–20)
CALCIUM: 8.8 mg/dL — AB (ref 8.9–10.3)
CO2: 20 mmol/L — AB (ref 22–32)
CREATININE: 0.69 mg/dL (ref 0.44–1.00)
Chloride: 109 mmol/L (ref 101–111)
Glucose, Bld: 150 mg/dL — ABNORMAL HIGH (ref 65–99)
Potassium: 3 mmol/L — ABNORMAL LOW (ref 3.5–5.1)
SODIUM: 137 mmol/L (ref 135–145)

## 2015-08-11 LAB — I-STAT BETA HCG BLOOD, ED (MC, WL, AP ONLY): I-stat hCG, quantitative: >2000 m[IU]/mL — ABNORMAL HIGH (ref <5)

## 2015-08-11 LAB — PREPARE RBC (CROSSMATCH)

## 2015-08-11 LAB — CBG MONITORING, ED: Glucose-Capillary: 163 mg/dL — ABNORMAL HIGH (ref 65–99)

## 2015-08-11 LAB — GLUCOSE, CAPILLARY: Glucose-Capillary: 138 mg/dL — ABNORMAL HIGH (ref 65–99)

## 2015-08-11 LAB — PROTIME-INR
INR: 1.04 (ref 0.00–1.49)
PROTHROMBIN TIME: 13.8 s (ref 11.6–15.2)

## 2015-08-11 LAB — I-STAT TROPONIN, ED: TROPONIN I, POC: 0 ng/mL (ref 0.00–0.08)

## 2015-08-11 LAB — HEMOGLOBIN AND HEMATOCRIT, BLOOD
HEMATOCRIT: 22.6 % — AB (ref 36.0–46.0)
HEMOGLOBIN: 7.5 g/dL — AB (ref 12.0–15.0)

## 2015-08-11 LAB — ABO/RH: ABO/RH(D): A POS

## 2015-08-11 LAB — HCG, QUANTITATIVE, PREGNANCY: HCG, BETA CHAIN, QUANT, S: 13014 m[IU]/mL — AB (ref <5)

## 2015-08-11 SURGERY — LAPAROSCOPY, DIAGNOSTIC
Anesthesia: General | Site: Abdomen

## 2015-08-11 MED ORDER — DEXAMETHASONE SODIUM PHOSPHATE 4 MG/ML IJ SOLN
INTRAMUSCULAR | Status: AC
  Filled 2015-08-11: qty 1

## 2015-08-11 MED ORDER — ROCURONIUM BROMIDE 100 MG/10ML IV SOLN
INTRAVENOUS | Status: AC
  Filled 2015-08-11: qty 1

## 2015-08-11 MED ORDER — CEFAZOLIN SODIUM-DEXTROSE 2-3 GM-% IV SOLR
2.0000 g | Freq: Once | INTRAVENOUS | Status: AC
  Administered 2015-08-11: 2 g via INTRAVENOUS

## 2015-08-11 MED ORDER — LACTATED RINGERS IV BOLUS (SEPSIS)
1000.0000 mL | Freq: Once | INTRAVENOUS | Status: AC
  Administered 2015-08-11: 1000 mL via INTRAVENOUS

## 2015-08-11 MED ORDER — LACTATED RINGERS IR SOLN
Status: DC | PRN
Start: 2015-08-11 — End: 2015-08-11
  Administered 2015-08-11: 3000 mL

## 2015-08-11 MED ORDER — HYDROMORPHONE HCL 1 MG/ML IJ SOLN
INTRAMUSCULAR | Status: AC
  Administered 2015-08-11: 0.5 mg via INTRAVENOUS
  Filled 2015-08-11: qty 1

## 2015-08-11 MED ORDER — HYDROMORPHONE HCL 1 MG/ML IJ SOLN
2.0000 mg | Freq: Once | INTRAMUSCULAR | Status: DC
  Filled 2015-08-11 (×2): qty 2

## 2015-08-11 MED ORDER — NEOSTIGMINE METHYLSULFATE 10 MG/10ML IV SOLN
INTRAVENOUS | Status: AC
  Filled 2015-08-11: qty 1

## 2015-08-11 MED ORDER — ROCURONIUM BROMIDE 100 MG/10ML IV SOLN
INTRAVENOUS | Status: DC | PRN
  Administered 2015-08-11: 10 mg via INTRAVENOUS
  Administered 2015-08-11: 40 mg via INTRAVENOUS

## 2015-08-11 MED ORDER — SUGAMMADEX SODIUM 200 MG/2ML IV SOLN
INTRAVENOUS | Status: AC
  Filled 2015-08-11: qty 2

## 2015-08-11 MED ORDER — HYDROMORPHONE HCL 1 MG/ML IJ SOLN
INTRAMUSCULAR | Status: AC
  Filled 2015-08-11: qty 1

## 2015-08-11 MED ORDER — KETOROLAC TROMETHAMINE 30 MG/ML IJ SOLN
INTRAMUSCULAR | Status: AC
  Filled 2015-08-11: qty 1

## 2015-08-11 MED ORDER — FENTANYL CITRATE (PF) 100 MCG/2ML IJ SOLN
100.0000 ug | Freq: Once | INTRAMUSCULAR | Status: AC
Start: 2015-08-11 — End: 2015-08-11
  Administered 2015-08-11: 100 ug via INTRAVENOUS
  Filled 2015-08-11: qty 2

## 2015-08-11 MED ORDER — PROMETHAZINE HCL 25 MG/ML IJ SOLN
12.5000 mg | Freq: Once | INTRAMUSCULAR | Status: AC
  Administered 2015-08-11: 12.5 mg via INTRAVENOUS
  Filled 2015-08-11: qty 1

## 2015-08-11 MED ORDER — SCOPOLAMINE 1 MG/3DAYS TD PT72
MEDICATED_PATCH | TRANSDERMAL | Status: AC
  Filled 2015-08-11: qty 1

## 2015-08-11 MED ORDER — MIDAZOLAM HCL 2 MG/2ML IJ SOLN
INTRAMUSCULAR | Status: AC
  Filled 2015-08-11: qty 2

## 2015-08-11 MED ORDER — CEFAZOLIN SODIUM-DEXTROSE 2-4 GM/100ML-% IV SOLN
INTRAVENOUS | Status: AC
  Filled 2015-08-11: qty 100

## 2015-08-11 MED ORDER — PROPOFOL 10 MG/ML IV BOLUS
INTRAVENOUS | Status: DC | PRN
  Administered 2015-08-11: 150 mg via INTRAVENOUS

## 2015-08-11 MED ORDER — ONDANSETRON HCL 4 MG/2ML IJ SOLN: 4.0000 mg | Freq: Four times a day (QID) | INTRAMUSCULAR | Status: DC | PRN

## 2015-08-11 MED ORDER — FAMOTIDINE IN NACL 20-0.9 MG/50ML-% IV SOLN
INTRAVENOUS | Status: AC
  Filled 2015-08-11: qty 50

## 2015-08-11 MED ORDER — GLYCOPYRROLATE 0.2 MG/ML IJ SOLN
INTRAMUSCULAR | Status: DC | PRN
  Administered 2015-08-11: 0.2 mg via INTRAVENOUS
  Administered 2015-08-11: 0.4 mg via INTRAVENOUS

## 2015-08-11 MED ORDER — DEXAMETHASONE SODIUM PHOSPHATE 4 MG/ML IJ SOLN
INTRAMUSCULAR | Status: DC | PRN
  Administered 2015-08-11: 4 mg via INTRAVENOUS

## 2015-08-11 MED ORDER — HYDROMORPHONE HCL 1 MG/ML IJ SOLN
0.2500 mg | INTRAMUSCULAR | Status: DC | PRN
  Administered 2015-08-11 (×4): 0.5 mg via INTRAVENOUS

## 2015-08-11 MED ORDER — KETOROLAC TROMETHAMINE 30 MG/ML IJ SOLN
30.0000 mg | Freq: Four times a day (QID) | INTRAMUSCULAR | Status: DC
  Administered 2015-08-12 – 2015-08-14 (×9): 30 mg via INTRAVENOUS
  Filled 2015-08-11 (×9): qty 1

## 2015-08-11 MED ORDER — KETOROLAC TROMETHAMINE 30 MG/ML IJ SOLN
30.0000 mg | Freq: Once | INTRAMUSCULAR | Status: AC
  Administered 2015-08-11: 30 mg via INTRAVENOUS

## 2015-08-11 MED ORDER — SUCCINYLCHOLINE CHLORIDE 20 MG/ML IJ SOLN
INTRAMUSCULAR | Status: AC
  Filled 2015-08-11: qty 1

## 2015-08-11 MED ORDER — LACTATED RINGERS IV SOLN
INTRAVENOUS | Status: DC | PRN
  Administered 2015-08-11: 17:00:00 via INTRAVENOUS

## 2015-08-11 MED ORDER — FENTANYL CITRATE (PF) 100 MCG/2ML IJ SOLN
50.0000 ug | Freq: Once | INTRAMUSCULAR | Status: AC
  Administered 2015-08-11: 50 ug via INTRAVENOUS
  Filled 2015-08-11: qty 2

## 2015-08-11 MED ORDER — GLYCOPYRROLATE 0.2 MG/ML IJ SOLN
INTRAMUSCULAR | Status: AC
  Filled 2015-08-11: qty 2

## 2015-08-11 MED ORDER — DEXAMETHASONE SODIUM PHOSPHATE 10 MG/ML IJ SOLN
INTRAMUSCULAR | Status: AC
  Filled 2015-08-11: qty 1

## 2015-08-11 MED ORDER — NEOSTIGMINE METHYLSULFATE 5 MG/5ML IV SOSY
PREFILLED_SYRINGE | INTRAVENOUS | Status: DC | PRN
  Administered 2015-08-11: 3 mg via INTRAVENOUS

## 2015-08-11 MED ORDER — SOD CITRATE-CITRIC ACID 500-334 MG/5ML PO SOLN
30.0000 mL | Freq: Once | ORAL | Status: AC
  Administered 2015-08-11: 30 mL via ORAL

## 2015-08-11 MED ORDER — OXYCODONE-ACETAMINOPHEN 5-325 MG PO TABS
1.0000 | ORAL_TABLET | ORAL | Status: DC | PRN
  Administered 2015-08-12 – 2015-08-14 (×9): 2 via ORAL
  Filled 2015-08-11 (×10): qty 2

## 2015-08-11 MED ORDER — BUPIVACAINE HCL (PF) 0.25 % IJ SOLN
INTRAMUSCULAR | Status: AC
  Filled 2015-08-11: qty 30

## 2015-08-11 MED ORDER — LACTATED RINGERS IV SOLN
INTRAVENOUS | Status: DC
  Administered 2015-08-11 – 2015-08-13 (×5): via INTRAVENOUS

## 2015-08-11 MED ORDER — SOD CITRATE-CITRIC ACID 500-334 MG/5ML PO SOLN
ORAL | Status: AC
  Filled 2015-08-11: qty 15

## 2015-08-11 MED ORDER — KETOROLAC TROMETHAMINE 30 MG/ML IJ SOLN: 30.0000 mg | Freq: Four times a day (QID) | INTRAMUSCULAR | Status: DC

## 2015-08-11 MED ORDER — MIDAZOLAM HCL 5 MG/5ML IJ SOLN
INTRAMUSCULAR | Status: DC | PRN
  Administered 2015-08-11: 2 mg via INTRAVENOUS

## 2015-08-11 MED ORDER — FAMOTIDINE IN NACL 20-0.9 MG/50ML-% IV SOLN
20.0000 mg | Freq: Once | INTRAVENOUS | Status: AC
  Administered 2015-08-11: 20 mg via INTRAVENOUS

## 2015-08-11 MED ORDER — MEPERIDINE HCL 25 MG/ML IJ SOLN: 6.2500 mg | INTRAMUSCULAR | Status: DC | PRN

## 2015-08-11 MED ORDER — BUPIVACAINE HCL (PF) 0.25 % IJ SOLN
INTRAMUSCULAR | Status: DC | PRN
  Administered 2015-08-11: 6 mL

## 2015-08-11 MED ORDER — LIDOCAINE HCL (CARDIAC) 20 MG/ML IV SOLN
INTRAVENOUS | Status: AC
  Filled 2015-08-11: qty 5

## 2015-08-11 MED ORDER — HYDROMORPHONE HCL 1 MG/ML IJ SOLN
1.0000 mg | INTRAMUSCULAR | Status: DC | PRN
  Administered 2015-08-11 – 2015-08-14 (×6): 1 mg via INTRAVENOUS
  Filled 2015-08-11 (×6): qty 1

## 2015-08-11 MED ORDER — ONDANSETRON HCL 4 MG/2ML IJ SOLN
INTRAMUSCULAR | Status: DC | PRN
  Administered 2015-08-11: 4 mg via INTRAVENOUS

## 2015-08-11 MED ORDER — PROPOFOL 10 MG/ML IV BOLUS
INTRAVENOUS | Status: AC
  Filled 2015-08-11: qty 20

## 2015-08-11 MED ORDER — GLYCOPYRROLATE 0.2 MG/ML IJ SOLN
INTRAMUSCULAR | Status: AC
  Filled 2015-08-11: qty 1

## 2015-08-11 MED ORDER — PROMETHAZINE HCL 25 MG/ML IJ SOLN: 12.5000 mg | Freq: Once | INTRAMUSCULAR | Status: DC

## 2015-08-11 MED ORDER — ONDANSETRON HCL 4 MG/2ML IJ SOLN
INTRAMUSCULAR | Status: AC
  Filled 2015-08-11: qty 2

## 2015-08-11 MED ORDER — ONDANSETRON HCL 4 MG PO TABS
4.0000 mg | ORAL_TABLET | Freq: Four times a day (QID) | ORAL | Status: DC | PRN
Start: 2015-08-11 — End: 2015-08-14
  Administered 2015-08-12: 4 mg via ORAL
  Filled 2015-08-11: qty 1

## 2015-08-11 MED ORDER — ONDANSETRON HCL 4 MG/2ML IJ SOLN: 4.0000 mg | Freq: Once | INTRAMUSCULAR | Status: DC | PRN

## 2015-08-11 MED ORDER — FENTANYL CITRATE (PF) 250 MCG/5ML IJ SOLN
INTRAMUSCULAR | Status: AC
  Filled 2015-08-11: qty 5

## 2015-08-11 MED ORDER — SODIUM CHLORIDE 0.9 % IV BOLUS (SEPSIS)
1000.0000 mL | Freq: Once | INTRAVENOUS | Status: AC
  Administered 2015-08-11: 1000 mL via INTRAVENOUS

## 2015-08-11 MED ORDER — PHENYLEPHRINE HCL 10 MG/ML IJ SOLN
INTRAMUSCULAR | Status: DC | PRN
Start: 2015-08-11 — End: 2015-08-11
  Administered 2015-08-11: 80 ug via INTRAVENOUS

## 2015-08-11 MED ORDER — FENTANYL CITRATE (PF) 100 MCG/2ML IJ SOLN
INTRAMUSCULAR | Status: AC
  Filled 2015-08-11: qty 2

## 2015-08-11 MED ORDER — HYDROMORPHONE HCL 2 MG/ML IJ SOLN
2.0000 mg | INTRAMUSCULAR | Status: DC | PRN
  Administered 2015-08-11: 2 mg via INTRAVENOUS
  Filled 2015-08-11: qty 1

## 2015-08-11 MED ORDER — FENTANYL CITRATE (PF) 100 MCG/2ML IJ SOLN
INTRAMUSCULAR | Status: DC | PRN
  Administered 2015-08-11 (×2): 100 ug via INTRAVENOUS
  Administered 2015-08-11: 50 ug via INTRAVENOUS
  Administered 2015-08-11: 100 ug via INTRAVENOUS

## 2015-08-11 MED ORDER — LACTATED RINGERS IV SOLN
INTRAVENOUS | Status: DC | PRN
  Administered 2015-08-11: 18:00:00 via INTRAVENOUS

## 2015-08-11 MED ORDER — LIDOCAINE HCL (CARDIAC) 20 MG/ML IV SOLN
INTRAVENOUS | Status: DC | PRN
  Administered 2015-08-11: 60 mg via INTRAVENOUS

## 2015-08-11 MED ORDER — PHENYLEPHRINE 40 MCG/ML (10ML) SYRINGE FOR IV PUSH (FOR BLOOD PRESSURE SUPPORT)
PREFILLED_SYRINGE | INTRAVENOUS | Status: AC
  Filled 2015-08-11: qty 10

## 2015-08-11 MED ORDER — KETOROLAC TROMETHAMINE 30 MG/ML IJ SOLN
INTRAMUSCULAR | Status: AC
  Administered 2015-08-11: 30 mg via INTRAVENOUS
  Filled 2015-08-11: qty 1

## 2015-08-11 MED ORDER — MIDAZOLAM HCL 2 MG/2ML IJ SOLN
INTRAMUSCULAR | Status: AC
Start: 2015-08-11 — End: 2015-08-11
  Filled 2015-08-11: qty 2

## 2015-08-11 SURGICAL SUPPLY — 46 items
BLADE SURG 10 STRL SS (BLADE) ×4 IMPLANT
CABLE HIGH FREQUENCY MONO STRZ (ELECTRODE) IMPLANT
CELLS DAT CNTRL 66122 CELL SVR (MISCELLANEOUS) ×2 IMPLANT
CLOSURE WOUND 1/2 X4 (GAUZE/BANDAGES/DRESSINGS)
CLOTH BEACON ORANGE TIMEOUT ST (SAFETY) ×4 IMPLANT
DRSG COVADERM PLUS 2X2 (GAUZE/BANDAGES/DRESSINGS) ×4 IMPLANT
DRSG OPSITE POSTOP 3X4 (GAUZE/BANDAGES/DRESSINGS) ×2 IMPLANT
DRSG OPSITE POSTOP 4X10 (GAUZE/BANDAGES/DRESSINGS) ×2 IMPLANT
DURAPREP 26ML APPLICATOR (WOUND CARE) ×4 IMPLANT
ELECT REM PT RETURN 9FT ADLT (ELECTROSURGICAL) ×4
ELECTRODE REM PT RTRN 9FT ADLT (ELECTROSURGICAL) IMPLANT
GLOVE BIO SURGEON STRL SZ 6.5 (GLOVE) ×3 IMPLANT
GLOVE BIO SURGEONS STRL SZ 6.5 (GLOVE) ×1
GLOVE BIOGEL PI IND STRL 7.0 (GLOVE) ×4 IMPLANT
GLOVE BIOGEL PI INDICATOR 7.0 (GLOVE) ×4
GOWN STRL REUS W/TWL LRG LVL3 (GOWN DISPOSABLE) ×8 IMPLANT
LIQUID BAND (GAUZE/BANDAGES/DRESSINGS) IMPLANT
NEEDLE INSUFFLATION 120MM (ENDOMECHANICALS) ×4 IMPLANT
NS IRRIG 1000ML POUR BTL (IV SOLUTION) ×2 IMPLANT
PACK LAPAROSCOPY BASIN (CUSTOM PROCEDURE TRAY) ×4 IMPLANT
PAD TRENDELENBURG POSITION (MISCELLANEOUS) ×4 IMPLANT
PENCIL BUTTON HOLSTER BLD 10FT (ELECTRODE) ×2 IMPLANT
RETRACTOR WND ALEXIS 18 MED (MISCELLANEOUS) IMPLANT
RTRCTR WOUND ALEXIS 18CM MED (MISCELLANEOUS) ×4
SET IRRIG TUBING LAPAROSCOPIC (IRRIGATION / IRRIGATOR) ×2 IMPLANT
SHEARS HARMONIC ACE PLUS 36CM (ENDOMECHANICALS) ×2 IMPLANT
SLEEVE XCEL OPT CAN 5 100 (ENDOMECHANICALS) ×4 IMPLANT
SPONGE LAP 18X18 X RAY DECT (DISPOSABLE) ×6 IMPLANT
STRIP CLOSURE SKIN 1/2X4 (GAUZE/BANDAGES/DRESSINGS) IMPLANT
SUT VIC AB 0 CT1 18XCR BRD8 (SUTURE) IMPLANT
SUT VIC AB 0 CT1 27 (SUTURE) ×4
SUT VIC AB 0 CT1 27XBRD ANBCTR (SUTURE) IMPLANT
SUT VIC AB 0 CT1 8-18 (SUTURE) ×4
SUT VIC AB 0 CTX 36 (SUTURE) ×4
SUT VIC AB 0 CTX36XBRD ANBCTRL (SUTURE) IMPLANT
SUT VIC AB 2-0 UR6 27 (SUTURE) ×2 IMPLANT
SUT VICRYL 0 UR6 27IN ABS (SUTURE) ×4 IMPLANT
SUT VICRYL 4-0 PS2 18IN ABS (SUTURE) ×4 IMPLANT
TOWEL OR 17X24 6PK STRL BLUE (TOWEL DISPOSABLE) ×8 IMPLANT
TRAY FOLEY BAG SILVER LF 16FR (SET/KITS/TRAYS/PACK) ×4 IMPLANT
TROCAR XCEL DIL TIP R 11M (ENDOMECHANICALS) ×4 IMPLANT
TUBING NON-CON 1/4 X 20 CONN (TUBING) ×1 IMPLANT
TUBING NON-CON 1/4 X 20' CONN (TUBING) ×1
WARMER LAPAROSCOPE (MISCELLANEOUS) ×4 IMPLANT
WATER STERILE IRR 1000ML POUR (IV SOLUTION) ×4 IMPLANT
YANKAUER SUCT BULB TIP NO VENT (SUCTIONS) ×2 IMPLANT

## 2015-08-11 NOTE — H&P (Signed)
Gail Weber is an 30 y.o. female. [id number] Patient's last menstrual period was 06/07/2015. 372w2d Planned termination of pregnancy this week when she developed pain and SOB and [facility]  [facility]. US showed ectopic pregnancy no pelvic free fluid  Cc: lower abdominal pain CP and SOB resolved Menstrual History:  Patient's last menstrual period was 06/07/2015.    Past Medical History  Diagnosis Date  . Asthma     Past Surgical History  Procedure Laterality Date  . No past surgeries      Family History  Problem Relation Age of Onset  . Heart murmur Mother   . Mental illness Mother   . Heart murmur Brother     Social History:  reports that she has been smoking Cigarettes.  She has a 2.5 pack-year smoking history. She does not have any smokeless tobacco history on file. She reports that she does not drink alcohol or use illicit drugs.  Allergies:  Allergies  Allergen Reactions  . Latex Hives    Prescriptions prior to admission  Medication Sig Dispense Refill Last Dose  . albuterol (PROVENTIL HFA;VENTOLIN HFA) 108 (90 BASE) MCG/ACT inhaler Inhale 2 puffs into the lungs every 6 (six) hours as needed. For shortness of breath.   - at Unknown  . lidocaine (XYLOCAINE JELLY) 2 % jelly Apply topically as needed. 30 mL 0   . metroNIDAZOLE (FLAGYL) 500 MG tablet Take 1 tablet (500 mg total) by mouth 2 (two) times daily. 14 tablet 0   . Pseudoephedrine-APAP-DM (DAYQUIL MULTI-SYMPTOM COLD/FLU PO) Take 1 tablet by mouth as needed. Cold sympt   04/16/2012 at Unknown    Review of Systems  Constitutional: Negative.   Respiratory: Positive for shortness of breath (resolved).   Cardiovascular: Positive for chest pain (resolved).  Genitourinary:       Vaginal bleeding and LLQ pain    Blood pressure 102/58, pulse 96, temperature 97.9 F (36.6 C), temperature source Oral, resp. rate 18, last menstrual period 06/07/2015, SpO2 100 %. Physical Exam  Vitals reviewed. Constitutional: She  is oriented to person, place, and time. She appears well-developed. No distress.  Cardiovascular: Normal rate.   Respiratory: Effort normal. No respiratory distress.  GI: Soft. She exhibits no mass. There is tenderness (mild lower quadrants ). There is no guarding.  Neurological: She is alert and oriented to person, place, and time.  Psychiatric: She has a normal mood and affect. Her behavior is normal.    Results for orders placed or performed during the hospital encounter of 08/11/15 (from the past 24 hour(s))  Basic metabolic panel     Status: Abnormal   Collection Time: 08/11/15 10:44 AM  Result Value Ref Range   Sodium 137 135 - 145 mmol/L   Potassium 3.0 (L) 3.5 - 5.1 mmol/L   Chloride 109 101 - 111 mmol/L   CO2 20 (L) 22 - 32 mmol/L   Glucose, Bld 150 (H) 65 - 99 mg/dL   BUN 20 6 - 20 mg/dL   Creatinine, Ser 4.540.69 0.44 - 1.00 mg/dL   Calcium 8.8 (L) 8.9 - 10.3 mg/dL   GFR calc non Af Amer >60 >60 mL/min   GFR calc Af Amer >60 >60 mL/min   Anion gap 8 5 - 15  CBC     Status: Abnormal   Collection Time: 08/11/15 10:44 AM  Result Value Ref Range   WBC 5.1 4.0 - 10.5 K/uL   RBC 3.85 (L) 3.87 - 5.11 MIL/uL   Hemoglobin 9.3 (L)  12.0 - 15.0 g/dL   HCT 16.1 (L) 09.6 - 04.5 %   MCV 72.2 (L) 78.0 - 100.0 fL   MCH 24.2 (L) 26.0 - 34.0 pg   MCHC 33.5 30.0 - 36.0 g/dL   RDW 40.9 81.1 - 91.4 %   Platelets 310 150 - 400 K/uL  CBG monitoring, ED     Status: Abnormal   Collection Time: 08/11/15 11:03 AM  Result Value Ref Range   Glucose-Capillary 163 (H) 65 - 99 mg/dL  Protime-INR     Status: None   Collection Time: 08/11/15 11:20 AM  Result Value Ref Range   Prothrombin Time 13.8 11.6 - 15.2 seconds   INR 1.04 0.00 - 1.49  I-stat troponin, ED     Status: None   Collection Time: 08/11/15 11:25 AM  Result Value Ref Range   Troponin i, poc 0.00 0.00 - 0.08 ng/mL   Comment 3          I-Stat Beta hCG blood, ED ([facility], [facility], [facility] only)     Status: Abnormal   Collection Time: 08/11/15 11:26  AM  Result Value Ref Range   I-stat hCG, quantitative >2000.0 (H) <5 mIU/mL   Comment 3            US Ob Comp Less 14 Wks  08/11/2015  EXAM: OBSTETRIC <14 WK [place] AND TRANSVAGINAL OB US TECHNIQUE: Both transabdominal and transvaginal ultrasound examinations were performed for complete evaluation of the gestation as well as the maternal uterus, adnexal regions, and pelvic cul-de-sac. Transvaginal technique was performed to assess early pregnancy. COMPARISON:  None. FINDINGS: Intrauterine gestational sac: None Right adnexal mass with a cystic structure consistent with a gestational sac. Right adnexal gestational sac contains the yolk sac, embryo without cardiac activity. Crown-rump length measures 9 mm dating the pregnancy is 6 weeks 6 days. Subchorionic hemorrhage:  None visualized. Maternal uterus/adnexae: Neither ovary is visualized. No pelvic free fluid. IMPRESSION: Right adnexal ectopic pregnancy without fetal heart rate detected consistent with fetal demise. Critical Value/emergent results were called by telephone at the time of interpretation on 08/11/2015 at 12:23 pm to Dr. Shaune Pollack , who verbally acknowledged these results. Electronically Signed   By: Elige Ko   On: 08/11/2015 12:27  quant HCG 13014  Assessment/Plan:  Unruptured right ectopic pregnancy with elevated HCG not a candidate for medical therapy.  Patient desires surgical management with laparoscopy and removal of ectopic pregnancy possible salpingectomy.  The risks of surgery were discussed in detail with the patient including but not limited to: bleeding which may require transfusion or reoperation; infection which may require prolonged hospitalization or re-hospitalization and antibiotic therapy; injury to bowel, bladder, ureters and major vessels or other surrounding organs; need for additional procedures including laparotomy; thromboembolic phenomenon, incisional problems and other postoperative or anesthesia complications.   Patient was told that the likelihood that her condition and symptoms will be treated effectively with this surgical management was very high; the postoperative expectations were also discussed in detail. The patient also understands the alternative treatment options which were discussed in full. All questions were answered.    ARNOLD,JAMES 08/11/2015, 1:02 PM

## 2015-08-11 NOTE — Op Note (Signed)
Gail Weber PROCEDURE DATE: 08/11/2015  PREOPERATIVE DIAGNOSIS: Ruptured ectopic pregnancy POSTOPERATIVE DIAGNOSIS: Right interstitial ectopic pregnancy PROCEDURE: Laparoscopy,  right cornual resection via laparotomy and removal of ectopic pregnancy SURGEON:  Adam Phenix, MD Assistant Dr Duane Lope ANESTHESIOLOGIST: Mal Amabile, MD Anesthesiologist: Leilani Able, MD; Mal Amabile, MD CRNA: Yolonda Kida, CRNA; Shanon Payor, CRNA  INDICATIONS: 30 y.o. G3P1011 at [redacted]w[redacted]d here with the preoperative diagnoses as listed above.  Please refer to preoperative notes for more details. Patient was counseled regarding need for laparoscopic salpingectomy. Risks of surgery including bleeding which may require transfusion or reoperation, infection, injury to bowel or other surrounding organs, need for additional procedures including laparotomy and other postoperative/anesthesia complications were explained to patient.  Written informed consent was obtained.  FINDINGS:  large amount of hemoperitoneum estimated to be about 700 ml of blood and clots.  Dilated right Cornu containing ectopic gestation. Small normal appearing uterus, normal right fallopian tube, right ovary and left ovary.  ANESTHESIA: General INTRAVENOUS FLUIDS: 2000 ml ESTIMATED BLOOD LOSS: 1000 ml including hemoperitoneum URINE OUTPUT: 100 ml SPECIMENS: Right uterine cornu containing ectopic gestation COMPLICATIONS: None immediate  PROCEDURE IN DETAIL:  The patient was taken to the operating room where general anesthesia was administered and was found to be adequate.  She was placed in the dorsal lithotomy position, and was prepped and draped in a sterile manner.  A Foley catheter was inserted into her bladder and attached to constant drainage and a uterine manipulator was then advanced into the uterus .    After an adequate timeout was performed, attention was turned to the abdomen where an umbilical incision was made with  the scalpel. The Veress needle was placed and CO2 was insufflated. The  11-mm trocar and sleeve were then advanced without difficulty.  .  A survey of the patient's pelvis and abdomen revealed the findings above.   5-mm left lower quadrant port were then placed under direct visualization.  The Nezhat suction irrigator was then used to suction the hemoperitoneum and irrigate the pelvis.  Attention was then turned to the right fallopian tube t. The cornu of the right side of the uterus was grossly swollen and hemorrhagic and appeared to be consistent with a cornual or interstitial ectopic pregnancy. The right fallopian tube and ovary otherwise were normal and the left adnexa were normal. After suctioning hemoperitoneum was elected to proceed with a laparotomy as was not thought that the resection of the cornu could be safely done laparoscopically. #10 blade was used to make a transverse lower abdominal Pfannenstiel incision approximately 10 cm long. Incision was carried down to the fascia and the fascia was incised and the incision was extended transversely. As of the rectus muscles were separated and peritoneum was entered. An Alexis retractor was placed. Lap pads were placed to pack the bowel. Pneumoperitoneum was further suctioned and the uterus was elevated. #10 blade and cautery were used to incise around the swollen portion of the right cornu and that portion of the uterus was resected including ectopic pregnancy with clot. The defect was closed with interrupted sutures with 0 Vicryl and good hemostasis was seen. The proximal end of the fallopian tube was visualized and was cauterized. Pelvis was irrigated. After good hemostasis was assured all packs were removed and the peritoneum was closed with 2-0 Vicryl. Fascia was closed with a running sutures were 0 Vicryl. Was irrigated and good hemostasis was seen. The skin was closed with a subcuticular suture with 4-0 Vicryl  and then liquid band was placed over the  incision. The umbilical incision was closed with 0 Vicryl suture in the fascia and the laparoscopic skin incisions were closed with Liquiband sterile dressing was applied. The patient tolerated the procedure well.  All instruments, needles, and sponge counts were correct x 2. The patient was taken to the recovery room in stable condition.     Adam PhenixJames G Arnold, MD 08/11/2015 6:36 PM

## 2015-08-11 NOTE — Progress Notes (Signed)
CNM notified that pt called out reporting SOB. CNM to BS. Pt upset that she is in pain and has not gone to OR yet. No C/O SOB. Informed her that we are waiting for Dr. Debroah LoopArnold to finish in OR and for her Quant result and that her vital signs, O2 sats are stable. Will give Dilaudid and have Dr. Debroah LoopArnold come see her ASAP. Will CTO VS closely.   Annalaura Saus[place] Quantarius Genrich, CNM 08/11/2015 3:07 PM

## 2015-08-11 NOTE — ED Provider Notes (Signed)
CSN: [id number]     Arrival date & time 08/11/15  1038 History   First MD Initiated Contact with Patient 08/11/15 1050     Chief Complaint  Patient presents with  . Chest Pain  . Shortness of Breath     (Consider location/radiation/quality/duration/timing/severity/associated sxs/prior Treatment) The history is provided by the patient.    30 year old G3 P1 at estimated 6-8 weeks by LMP who presents with acute onset of diffuse abdominal pain and shortness of breath. The patient states that she was diagnosed as pregnant approximately 2 months ago. She did not desire the pregnancy so she plan on getting an abortion and had an appointment scheduled this week. However, earlier today while changing clothes, she had acute onset of initially lower than generalized abdominal pain. She also then developed chest pain which she describes as pain with deep inspiration. She also endorses general lightheadedness and dizziness. She socially presents to the ED for further evaluation. Denies any blood thinner use. She has never had any ultrasound performed at this pregnancy.  Past Medical History  Diagnosis Date  . Asthma    Past Surgical History  Procedure Laterality Date  . No past surgeries     Family History  Problem Relation Age of Onset  . Heart murmur Mother   . Mental illness Mother   . Heart murmur Brother    Social History  Substance Use Topics  . Smoking status: Current Every Day Smoker -- 0.50 packs/day for 5 years    Types: Cigarettes  . Smokeless tobacco: None  . Alcohol Use: No   OB History    Gravida Para Term Preterm AB TAB SAB Ectopic Multiple Living   3 1 1  1  1   1      Review of Systems  Constitutional: Positive for fatigue. Negative for fever and chills.  HENT: Negative for congestion and rhinorrhea.   Eyes: Negative for visual disturbance.  Respiratory: Positive for shortness of breath. Negative for cough and wheezing.   Cardiovascular: Positive for chest pain.  Negative for leg swelling.  Gastrointestinal: Positive for nausea and abdominal pain. Negative for vomiting, diarrhea and abdominal distention.  Genitourinary: Negative for dysuria and flank pain.  Musculoskeletal: Negative for neck stiffness.  Skin: Negative for rash.  Neurological: Negative for syncope, weakness and headaches.      Allergies  Latex  Home Medications   Prior to Admission medications   Medication Sig Start Date End Date Taking? Authorizing Provider  albuterol (PROVENTIL HFA;VENTOLIN HFA) 108 (90 BASE) MCG/ACT inhaler Inhale 2 puffs into the lungs every 6 (six) hours as needed. For shortness of breath.    Historical Provider, MD   BP 93/51 mmHg  Pulse 69  Temp(Src) 97.9 F (36.6 C) (Oral)  Resp 18  SpO2 100%  LMP 06/07/2015 Physical Exam  Constitutional: She is oriented to person, place, and time. She appears well-developed and well-nourished. She appears distressed.  HENT:  Head: Normocephalic.  Mouth/Throat: No oropharyngeal exudate.  Eyes: Conjunctivae are normal. Pupils are equal, round, and reactive to light.  Neck: Normal range of motion. Neck supple.  Cardiovascular: Normal rate, regular rhythm and normal heart sounds.  Exam reveals no friction rub.   No murmur heard. Pulmonary/Chest: Effort normal. No respiratory distress. She has no wheezes. She has no rales.  Abdominal: Soft. Bowel sounds are normal. She exhibits no distension. There is tenderness (Generalized). There is guarding. There is no rebound.  Musculoskeletal: She exhibits no edema.  Neurological: She is alert and  oriented to person, place, and time. She exhibits normal muscle tone.  Skin: Skin is warm. No rash noted. She is diaphoretic.  Nursing note and vitals reviewed.   ED Course  .Critical Care Performed by: Shaune Pollack Authorized by: Shaune Pollack Total critical care time: 35 minutes Critical care time was exclusive of separately billable procedures and treating other  patients. Critical care was necessary to treat or prevent imminent or life-threatening deterioration of the following conditions: circulatory failure and shock. Critical care was time spent personally by me on the following activities: development of treatment plan with patient or surrogate, discussions with consultants, evaluation of patient's response to treatment, examination of patient, obtaining history from patient or surrogate, ordering and performing treatments and interventions, ordering and review of laboratory studies, ordering and review of radiographic studies and re-evaluation of patient's condition. Subsequent provider of critical care: I assumed direction of critical care for this patient from another provider of my specialty.   (including critical care time)  EMERGENCY DEPARTMENT [place] FAST EXAM  INDICATIONS:Hyoptension, concern for ectopic  PERFORMED BY: Myself  IMAGES ARCHIVED?: Yes  FINDINGS: RUQ view positive, Pelvic view positive and Pericardial effusion absent  LIMITATIONS:  Emergent procedure  INTERPRETATION:  Abdominal free fluid present  COMMENT:  Large volume free fluid in pelvis with ? Adnexal mass on brief pelvic views.  Labs Review Labs Reviewed  BASIC METABOLIC PANEL - Abnormal; Notable for the following:    Potassium 3.0 (*)    CO2 20 (*)    Glucose, Bld 150 (*)    Calcium 8.8 (*)    All other components within normal limits  CBC - Abnormal; Notable for the following:    RBC 3.85 (*)    Hemoglobin 9.3 (*)    HCT 27.8 (*)    MCV 72.2 (*)    MCH 24.2 (*)    All other components within normal limits  HCG, QUANTITATIVE, PREGNANCY - Abnormal; Notable for the following:    hCG, Beta Chain, Quant, S 13014 (*)    All other components within normal limits  CBC - Abnormal; Notable for the following:    RBC 3.40 (*)    Hemoglobin 8.0 (*)    HCT 24.6 (*)    MCV 72.4 (*)    MCH 23.5 (*)    All other components within normal limits  CBG MONITORING, ED -  Abnormal; Notable for the following:    Glucose-Capillary 163 (*)    All other components within normal limits  I-STAT BETA HCG BLOOD, ED ([facility], [facility], [facility] ONLY) - Abnormal; Notable for the following:    I-stat hCG, quantitative >2000.0 (*)    All other components within normal limits  PROTIME-INR  URINALYSIS, ROUTINE W REFLEX MICROSCOPIC (NOT AT [facility])  URINE RAPID DRUG SCREEN, HOSP PERFORMED  I-STAT TROPOININ, ED  I-STAT CHEM 8, ED  TYPE AND SCREEN  TYPE AND SCREEN  ABO/RH    Imaging Review US Ob Comp Less 14 Wks  08/11/2015  EXAM: OBSTETRIC <14 WK [place] AND TRANSVAGINAL OB US TECHNIQUE: Both transabdominal and transvaginal ultrasound examinations were performed for complete evaluation of the gestation as well as the maternal uterus, adnexal regions, and pelvic cul-de-sac. Transvaginal technique was performed to assess early pregnancy. COMPARISON:  None. FINDINGS: Intrauterine gestational sac: None Right adnexal mass with a cystic structure consistent with a gestational sac. Right adnexal gestational sac contains the yolk sac, embryo without cardiac activity. Crown-rump length measures 9 mm dating the pregnancy is 6 weeks 6 days.  Subchorionic hemorrhage:  None visualized. Maternal uterus/adnexae: Neither ovary is visualized. No pelvic free fluid. IMPRESSION: Right adnexal ectopic pregnancy without fetal heart rate detected consistent with fetal demise. Critical Value/emergent results were called by telephone at the time of interpretation on 08/11/2015 at 12:23 pm to Dr. Shaune Pollack , who verbally acknowledged these results. Electronically Signed   By: Elige Ko   On: 08/11/2015 12:27   I have personally reviewed and evaluated these images and lab results as part of my medical decision-making.   EKG Interpretation   Date/Time:  Wednesday August 11 2015 10:46:49 EDT Ventricular Rate:  74 PR Interval:    QRS Duration: 94 QT Interval:  409 QTC Calculation: 454 R Axis:   65 Text Interpretation:   Incomplete analysis due to missing data in  precordial lead(s) Sinus rhythm Missing lead(s): V5 - RN notified for  repeat No previous EKGs for comparison No acute ST segment changes or  signs of ischemia Confirmed by Erma Heritage MD, Sheria Lang [phone]) on 08/11/2015  3:19:34 PM      MDM  30 year old female with no reported past medical history who presents with acute onset diffuse abdominal pain and chest pain. On arrival, patient mildly hypotensive as well as diaphoretic. She has diffuse abdominal guarding. Primary concern given pregnancy is possible ruptured ectopic pregnancy. Bedside ultrasound socially performed by myself and shows free fluid on FAST exam as well as complex right adnexal mass. Will place peripheral IVs 2, type and screen, and discuss with OB. Ultrasound notified and immediately at bedside for formal ultrasound for operative planning and confirmation. Otherwise, will monitor closely.   BP improving with gentle IVF. Hemoglobin appears to be at baseline. Other lab work is unremarkable. Type and screen is pending. Ultrasound confirms ectopic. Dr. Debroah Loop, OB, consulted. He initially requested evaluation in [facility] ED but due to availability of OR at [facility], decision ultimately made to transfer emergently for OR intervention. Pt consented and updated. I spent significant time with patient and sister discussing diagnosis and course of care. Risks of transfer discussed.  Clinical Impression: 1. Ruptured ectopic pregnancy   2. Abdominal pain   3. Pain     Disposition: [facility]   Shaune Pollack, MD 08/11/15 [phone]

## 2015-08-11 NOTE — Anesthesia Preprocedure Evaluation (Signed)
Anesthesia Evaluation  Patient identified by MRN, date of birth, ID band Patient awake    Reviewed: Allergy & Precautions, H&P , NPO status , Patient's Chart, lab work & pertinent test results  Airway Mallampati: I  TM Distance: >3 FB Neck ROM: full    Dental no notable dental hx.    Pulmonary Current Smoker,    Pulmonary exam normal        Cardiovascular negative cardio ROS Normal cardiovascular exam     Neuro/Psych negative neurological ROS  negative psych ROS   GI/Hepatic negative GI ROS, Neg liver ROS,   Endo/Other  negative endocrine ROS  Renal/GU negative Renal ROS     Musculoskeletal   Abdominal Normal abdominal exam  (+)   Peds  Hematology   Anesthesia Other Findings   Reproductive/Obstetrics (+) Pregnancy                             Anesthesia Physical Anesthesia Plan  ASA: II  Anesthesia Plan: General   Post-op Pain Management:    Induction: Intravenous  Airway Management Planned: Oral ETT  Additional Equipment:   Intra-op Plan:   Post-operative Plan: Extubation in OR  Informed Consent: I have reviewed the patients History and Physical, chart, labs and discussed the procedure including the risks, benefits and alternatives for the proposed anesthesia with the patient or authorized representative who has indicated his/her understanding and acceptance.   Dental advisory given  Plan Discussed with: CRNA and Surgeon  Anesthesia Plan Comments:         Anesthesia Quick Evaluation

## 2015-08-11 NOTE — MAU Note (Signed)
[facility], to go to OR.

## 2015-08-11 NOTE — ED Notes (Signed)
Pt reports sudden onset of R side cp this am with SOB.  States she think she is bleeding internally.  Pt also reports she found out 2 months ago that she is pregnant, no prenatal care, states she did not plan of keeping the baby.   Pt also reports abd pain, denies any vaginal bleeding at this time.

## 2015-08-11 NOTE — MAU Note (Signed)
Dr. Debroah LoopArnold in to discuss surgery with pt.

## 2015-08-11 NOTE — ED Notes (Signed)
Pt did not want to put gown on, states she feels hot. Pt is diaphoretic.  Asked pt to at least cover up because her boobs  Are exposed.

## 2015-08-11 NOTE — Anesthesia Postprocedure Evaluation (Signed)
Anesthesia Post Note  Patient: Gail Weber  Procedure(s) Performed: Procedure(s) (LRB): LAPAROSCOPY DIAGNOSTIC (N/A) LAPAROTOMY WITH EXCISION INTERSTITIAL ECTOPIC RIGHT  Patient location during evaluation: PACU Anesthesia Type: General Level of consciousness: awake and alert Pain management: pain level controlled Vital Signs Assessment: post-procedure vital signs reviewed and stable Respiratory status: spontaneous breathing, nonlabored ventilation, respiratory function stable and patient connected to nasal cannula oxygen Cardiovascular status: blood pressure returned to baseline and stable Postop Assessment: no signs of nausea or vomiting Anesthetic complications: no     Last Vitals:  Filed Vitals:   08/11/15 1930 08/11/15 1945  BP: 120/61 97/56  Pulse: 74 50  Temp:    Resp: 14 12    Last Pain:  Filed Vitals:   08/11/15 2000  PainSc: Asleep   Pain Goal:                 Shiraz Bastyr A.

## 2015-08-11 NOTE — Anesthesia Procedure Notes (Signed)
Procedure Name: Intubation Date/Time: 08/11/2015 5:25 PM Performed by: Shanon PayorGREGORY, Gail Weber Pre-anesthesia Checklist: Patient identified, Emergency Drugs available, Suction available, Patient being monitored and Timeout performed Patient Re-evaluated:Patient Re-evaluated prior to inductionOxygen Delivery Method: Circle system utilized Preoxygenation: Pre-oxygenation with 100% oxygen Intubation Type: IV induction Ventilation: Mask ventilation without difficulty Laryngoscope Size: Mac and 3 Grade View: Grade I Tube type: Oral Tube size: 7.0 mm Number of attempts: 1 Airway Equipment and Method: Stylet Placement Confirmation: ETT inserted through vocal cords under direct vision,  positive ETCO2 and breath sounds checked- equal and bilateral Secured at: 21 cm Tube secured with: Tape Dental Injury: Teeth and Oropharynx as per pre-operative assessment

## 2015-08-11 NOTE — Transfer of Care (Signed)
Immediate Anesthesia Transfer of Care Note  Patient: Gail Weber  Procedure(s) Performed: Procedure(s): LAPAROSCOPY DIAGNOSTIC (N/A) LAPAROTOMY WITH EXCISION INTERSTITIAL ECTOPIC RIGHT  Patient Location: PACU  Anesthesia Type:General  Level of Consciousness: awake, alert  and oriented  Airway & Oxygen Therapy: Patient Spontanous Breathing and Patient connected to nasal cannula oxygen  Post-op Assessment: Report given to RN and Post -op Vital signs reviewed and stable  Post vital signs: Reviewed and stable  Last Vitals:  Filed Vitals:   08/11/15 1333 08/11/15 1509  BP: 93/51 104/53  Pulse: 69 77  Temp:    Resp:      Last Pain:  Filed Vitals:   08/11/15 1509  PainSc: 10-Worst pain ever         Complications: No apparent anesthesia complications

## 2015-08-12 LAB — CBC
HCT: 18.5 % — ABNORMAL LOW (ref 36.0–46.0)
HEMATOCRIT: 18.1 % — AB (ref 36.0–46.0)
Hemoglobin: 6.2 g/dL — CL (ref 12.0–15.0)
Hemoglobin: 6.1 g/dL — CL (ref 12.0–15.0)
MCH: 24.2 pg — AB (ref 26.0–34.0)
MCH: 24.4 pg — ABNORMAL LOW (ref 26.0–34.0)
MCHC: 33.5 g/dL (ref 30.0–36.0)
MCHC: 33.7 g/dL (ref 30.0–36.0)
MCV: 72.3 fL — AB (ref 78.0–100.0)
MCV: 72.4 fL — ABNORMAL LOW (ref 78.0–100.0)
PLATELETS: 216 10*3/uL (ref 150–400)
PLATELETS: 233 10*3/uL (ref 150–400)
RBC: 2.56 MIL/uL — AB (ref 3.87–5.11)
RBC: 2.5 MIL/uL — ABNORMAL LOW (ref 3.87–5.11)
RDW: 14.5 % (ref 11.5–15.5)
RDW: 14.5 % (ref 11.5–15.5)
WBC: 10 10*3/uL (ref 4.0–10.5)
WBC: 10.9 10*3/uL — AB (ref 4.0–10.5)

## 2015-08-12 LAB — URINALYSIS, ROUTINE W REFLEX MICROSCOPIC
Bilirubin Urine: NEGATIVE
Glucose, UA: NEGATIVE mg/dL
KETONES UR: NEGATIVE mg/dL
NITRITE: NEGATIVE
PROTEIN: NEGATIVE mg/dL
Specific Gravity, Urine: <1.005 — ABNORMAL LOW (ref 1.005–1.030)
pH: 6 (ref 5.0–8.0)

## 2015-08-12 LAB — RAPID URINE DRUG SCREEN, HOSP PERFORMED
Amphetamines: NOT DETECTED
BENZODIAZEPINES: POSITIVE — AB
Barbiturates: NOT DETECTED
COCAINE: NOT DETECTED
Opiates: POSITIVE — AB
Tetrahydrocannabinol: POSITIVE — AB

## 2015-08-12 LAB — URINE MICROSCOPIC-ADD ON

## 2015-08-12 MED ORDER — DOCUSATE SODIUM 100 MG PO CAPS
100.0000 mg | ORAL_CAPSULE | Freq: Two times a day (BID) | ORAL | Status: DC | PRN
  Administered 2015-08-12: 100 mg via ORAL
  Filled 2015-08-12: qty 1

## 2015-08-12 MED ORDER — SODIUM CHLORIDE 0.9 % IV SOLN
510.0000 mg | Freq: Once | INTRAVENOUS | Status: AC
  Administered 2015-08-12: 510 mg via INTRAVENOUS
  Filled 2015-08-12: qty 17

## 2015-08-12 NOTE — Progress Notes (Signed)
CRITICAL VALUE ALERT  Critical value received:  hgb 6.1  Date of notification:  08/12/15  Time of notification:  1500  Critical value read back: yes  Nurse who received alert:  Almeta Geisel  MD notified (1st page):  Dr. Holly BodilyBrein  Time of first page: 1528

## 2015-08-12 NOTE — Addendum Note (Signed)
Addendum  created 08/12/15 0758 by Junious SilkMelinda Thinh Cuccaro, CRNA   Modules edited: Clinical Notes   Clinical Notes:  File: [id number]

## 2015-08-12 NOTE — Progress Notes (Signed)
CRITICAL VALUE ALERT  Critical value received: Hgb 6.2  Date of notification: 08/12/15  Time of notification: 0700  Critical value read back: yes Nurse who received alert:  CHRIS G  MD notified (1st page):Dr Eure  Time of first page:  0700  MD notified (2nd page):  Time of second page: NA  Responding MD:  DR Despina HiddenEURE  Time MD responded: na

## 2015-08-12 NOTE — Progress Notes (Signed)
Patient ID: Gail Weber, female   DOB: [birth year], 30 y.o.   MRN: [id number]  Asked to see patient - complaining of pain and states that she didn't get the pain medicine that was documented at 1745.  Pt was wanted to be discharged from hospital because we weren't treating her pain.  Patient was concerned about how much pain she was having.  I discussed with her the type of surgery that she had and that the amount of pain that she was having was normal.  Blood counts stable.  Pt due for percocet now, which was given in my presence.  Patient calmed down and okay to stay.  I explained that we would continue to treat her pain appropriately.  Levie HeritageJacob J Caled[place] Zou, DO 08/12/2015 9:57 PM

## 2015-08-12 NOTE — Progress Notes (Signed)
1 Day Post-Op Procedure(s) (LRB): LAPAROSCOPY DIAGNOSTIC (N/A) LAPAROTOMY WITH EXCISION INTERSTITIAL ECTOPIC RIGHT  Subjective: Patient reports incisional pain.   Otherwise feels pretty good Objective: I have reviewed patient's vital signs, intake and output, medications and labs.  General: alert, cooperative and no distress GI: soft, non-tender; bowel sounds normal; no masses,  no organomegaly and incision: clean, dry and intact  CBC CBC Latest Ref Rng 08/12/2015 08/11/2015 08/11/2015  WBC 4.0 - 10.5 K/uL 10.0 10.8(H) -  Hemoglobin 12.0 - 15.0 g/dL 6.2(LL) 7.2(L) 7.5(L)  Hematocrit 36.0 - 46.0 % 18.5(L) 21.7(L) 22.6(L)  Platelets 150 - 400 K/uL 216 247 -     Assessment: s/p Procedure(s): LAPAROSCOPY DIAGNOSTIC (N/A) LAPAROTOMY WITH EXCISION INTERSTITIAL ECTOPIC RIGHT: stable  Plan: Advance diet Encourage ambulation Advance to PO medication will see if tolerates ambulation, pt aware may need a blood transfusion  Recheck CBC at 1400  LOS: 1 day    Deshonda Cryderman H 08/12/2015, 7:27 AM

## 2015-08-12 NOTE — Anesthesia Postprocedure Evaluation (Signed)
Anesthesia Post Note  Patient: Gail Weber  Procedure(s) Performed: Procedure(s) (LRB): LAPAROSCOPY DIAGNOSTIC (N/A) LAPAROTOMY WITH EXCISION INTERSTITIAL ECTOPIC RIGHT  Patient location during evaluation: [facility] Anesthesia Type: General Level of consciousness: awake and alert Pain management: pain level controlled Vital Signs Assessment: post-procedure vital signs reviewed and stable Respiratory status: spontaneous breathing, nonlabored ventilation and respiratory function stable Cardiovascular status: blood pressure returned to baseline and stable Postop Assessment: no signs of nausea or vomiting Anesthetic complications: no     Last Vitals:  Filed Vitals:   08/12/15 0121 08/12/15 0646  BP: 99/56 94/56  Pulse: 60 58  Temp: 36.9 C 37.1 C  Resp: 18 14    Last Pain:  Filed Vitals:   08/12/15 0657  PainSc: 3    Pain Goal: Patients Stated Pain Goal: 3 (08/12/15 0500)               Junious SilkGILBERT,Bellarae Lizer

## 2015-08-13 DIAGNOSIS — D62 Acute posthemorrhagic anemia: Secondary | ICD-10-CM

## 2015-08-13 DIAGNOSIS — O009 Unspecified ectopic pregnancy without intrauterine pregnancy: Secondary | ICD-10-CM

## 2015-08-13 LAB — CBC WITH DIFFERENTIAL/PLATELET
BASOS ABS: 0 10*3/uL (ref 0.0–0.1)
Basophils Relative: 0 %
EOS ABS: 0.2 10*3/uL (ref 0.0–0.7)
EOS PCT: 2 %
HCT: 23.4 % — ABNORMAL LOW (ref 36.0–46.0)
Hemoglobin: 8 g/dL — ABNORMAL LOW (ref 12.0–15.0)
LYMPHS PCT: 32 %
Lymphs Abs: 3.1 10*3/uL (ref 0.7–4.0)
MCH: 26.3 pg (ref 26.0–34.0)
MCHC: 34.2 g/dL (ref 30.0–36.0)
MCV: 77 fL — AB (ref 78.0–100.0)
MONO ABS: 0.4 10*3/uL (ref 0.1–1.0)
Monocytes Relative: 4 %
Neutro Abs: 5.8 10*3/uL (ref 1.7–7.7)
Neutrophils Relative %: 61 %
PLATELETS: 179 10*3/uL (ref 150–400)
RBC: 3.04 MIL/uL — AB (ref 3.87–5.11)
RDW: 15.6 % — AB (ref 11.5–15.5)
WBC: 9.5 10*3/uL (ref 4.0–10.5)

## 2015-08-13 LAB — CBC
HEMATOCRIT: 14.9 % — AB (ref 36.0–46.0)
Hemoglobin: 5.1 g/dL — CL (ref 12.0–15.0)
MCH: 24.9 pg — ABNORMAL LOW (ref 26.0–34.0)
MCHC: 34.2 g/dL (ref 30.0–36.0)
MCV: 72.7 fL — ABNORMAL LOW (ref 78.0–100.0)
Platelets: 164 10*3/uL (ref 150–400)
RBC: 2.05 MIL/uL — ABNORMAL LOW (ref 3.87–5.11)
RDW: 14.8 % (ref 11.5–15.5)
WBC: 8.4 10*3/uL (ref 4.0–10.5)

## 2015-08-13 LAB — PREPARE RBC (CROSSMATCH)

## 2015-08-13 MED ORDER — DIPHENHYDRAMINE HCL 25 MG PO CAPS
25.0000 mg | ORAL_CAPSULE | Freq: Once | ORAL | Status: AC
  Administered 2015-08-13: 25 mg via ORAL
  Filled 2015-08-13: qty 1

## 2015-08-13 MED ORDER — ALBUTEROL SULFATE (2.5 MG/3ML) 0.083% IN NEBU
2.5000 mg | INHALATION_SOLUTION | RESPIRATORY_TRACT | Status: DC | PRN
  Administered 2015-08-13: 2.5 mg via RESPIRATORY_TRACT
  Filled 2015-08-13 (×2): qty 3

## 2015-08-13 MED ORDER — SODIUM CHLORIDE 0.9 % IV SOLN
Freq: Once | INTRAVENOUS | Status: AC
  Administered 2015-08-13: 09:00:00 via INTRAVENOUS

## 2015-08-13 MED ORDER — ACETAMINOPHEN 325 MG PO TABS
650.0000 mg | ORAL_TABLET | Freq: Once | ORAL | Status: AC
  Administered 2015-08-13: 650 mg via ORAL
  Filled 2015-08-13: qty 2

## 2015-08-13 NOTE — Progress Notes (Signed)
Subjective: Patient reports incisional pain, tolerating PO, + flatus and no problems voiding.  Pain improving.  Objective: I have reviewed patient's vital signs, intake and output, medications and labs.  General: alert, cooperative and no distress Resp: clear to auscultation bilaterally Cardio: regular rate and rhythm, S1, S2 normal, no murmur, click, rub or gallop GI: normal findings: bowel sounds normal and soft, abnormal findings:  moderate tenderness in the lower abdomen and incision: clean, dry and intact Extremities: extremities normal, atraumatic, no cyanosis or edema and Homans sign is negative, no sign of DVT  CBC Latest Ref Rng 08/13/2015 08/12/2015 08/12/2015  WBC 4.0 - 10.5 K/uL 8.4 10.9(H) 10.0  Hemoglobin 12.0 - 15.0 g/dL 5.1(LL) 6.1(LL) 6.2(LL)  Hematocrit 36.0 - 46.0 % 14.9(L) 18.1(L) 18.5(L)  Platelets 150 - 400 K/uL 164 233 216      Assessment/Plan: 1.  POD#2 Open lap with  Right cornual resection for ectopic pregnancy 2.  Post-operative anemia  Transfuse 3 units  Check H&H afterwards  Abdominal pain improving, but if worsens, may order CT with contrast to evaluate intra-abdominal bleeding.   LOS: 2 days    Gail Weber Gail Weber 08/13/2015, 6:16 AM

## 2015-08-13 NOTE — Progress Notes (Signed)
Dr. Adrian BlackwaterStinson at bedside to talk to patient about blood transfusion. Dr. Adrian BlackwaterStinson also talked with patient's mother via telephone and discussed all concerns. Will continue to monitor. Carmelina DaneERRI L Dal Blew, RN

## 2015-08-13 NOTE — Progress Notes (Signed)
2130 - Pt called out for pain med and stated she was going to call 911 because she states we did not believe she was in pain. When talking with patient previously at 1952 patients pain was a 3/10. Reviewed patients pain meds at this time and reviewed next time that she could take them and frequency, patient stated she understood. Patient requested stool softenern at that time, [id number]- F. Cresenzo-Dishmon, CNM notified for order for colace. Dr. Adrian BlackwaterStinson called at 2130, en route to see patient.

## 2015-08-13 NOTE — Progress Notes (Signed)
Received a call from someone wanting to talk to charge nurse. Charge unavailable, the person on the line states a pt in rm 320 had called 911 because no one wants to give her pain meds. Pt's rn and house coverage notified

## 2015-08-13 NOTE — Progress Notes (Signed)
CRITICAL VALUE ALERT  Critical value received: HgB 5.1  Date of notification:  08/13/15  Time of notification: 0550  Critical value read back:Yes.    Nurse who received alert:  Alroy BailiffJ. Linet Brash, RN  MD notified: Dr. Adrian BlackwaterStinson @ [phone]; no new orders

## 2015-08-14 ENCOUNTER — Encounter (HOSPITAL_COMMUNITY): Payer: Self-pay | Admitting: Obstetrics and Gynecology

## 2015-08-14 LAB — TYPE AND SCREEN
ABO/RH(D): A POS
Antibody Screen: NEGATIVE
UNIT DIVISION: 0
UNIT DIVISION: 0
UNIT DIVISION: 0

## 2015-08-14 LAB — CBC WITH DIFFERENTIAL/PLATELET
BASOS ABS: 0 10*3/uL (ref 0.0–0.1)
Basophils Relative: 0 %
Eosinophils Absolute: 0.3 10*3/uL (ref 0.0–0.7)
Eosinophils Relative: 4 %
HEMATOCRIT: 22.5 % — AB (ref 36.0–46.0)
Hemoglobin: 7.7 g/dL — ABNORMAL LOW (ref 12.0–15.0)
LYMPHS PCT: 37 %
Lymphs Abs: 2.7 10*3/uL (ref 0.7–4.0)
MCH: 26.3 pg (ref 26.0–34.0)
MCHC: 34.2 g/dL (ref 30.0–36.0)
MCV: 76.8 fL — AB (ref 78.0–100.0)
MONO ABS: 0.3 10*3/uL (ref 0.1–1.0)
Monocytes Relative: 4 %
NEUTROS ABS: 4 10*3/uL (ref 1.7–7.7)
Neutrophils Relative %: 55 %
Platelets: 184 10*3/uL (ref 150–400)
RBC: 2.93 MIL/uL — AB (ref 3.87–5.11)
RDW: 15.5 % (ref 11.5–15.5)
WBC: 7.2 10*3/uL (ref 4.0–10.5)

## 2015-08-14 MED ORDER — KETOROLAC TROMETHAMINE 10 MG PO TABS: 10.0000 mg | ORAL_TABLET | Freq: Four times a day (QID) | ORAL | Status: DC | PRN

## 2015-08-14 MED ORDER — DOCUSATE SODIUM 100 MG PO CAPS: 100.0000 mg | ORAL_CAPSULE | Freq: Two times a day (BID) | ORAL | Status: DC | PRN

## 2015-08-14 MED ORDER — OXYCODONE-ACETAMINOPHEN 7.5-325 MG PO TABS: 1.0000 | ORAL_TABLET | Freq: Four times a day (QID) | ORAL | Status: DC | PRN

## 2015-08-14 NOTE — Discharge Summary (Signed)
Physician Discharge Summary  Patient ID: Gail Weber MRN: [id number] DOB/AGE: 23-Jun-1985 30 y.o.  Admit date: 08/11/2015 Discharge date: 08/14/2015  Admission Diagnoses:  Discharge Diagnoses:  Active Problems:   Ruptured ectopic pregnancy   Ectopic pregnancy   Discharged Condition: good  Hospital Course: Gail Weber is an 30 y.o. female. [id number] Patient's last menstrual period was 06/07/2015. At [redacted]w[redacted]d Planned termination of pregnancy this week when she developed pain and SOB and presented to [facility] ED Today. US showed ectopic pregnancy no pelvic free fluid  Consults: gynecology  Significant Diagnostic Studies: radiology: Ultrasound: right ectopic , initially thought to be unruptured, transferred to [facility] hospital for continued care. CBC Latest Ref Rng 08/14/2015 08/13/2015 08/13/2015  WBC 4.0 - 10.5 K/uL 7.2 9.5 8.4  Hemoglobin 12.0 - 15.0 g/dL 7.7(L) 8.0(L) 5.1(LL)  Hematocrit 36.0 - 46.0 % 22.5(L) 23.4(L) 14.9(L)  Platelets 150 - 400 K/uL 184 179 164     Treatments: surgery: laparoscopy then laparotomy for ruptured right cornual ectopic FINDINGS: large amount of hemoperitoneum estimated to be about 700 ml -1000 ml of blood and clots. Dilated right Cornu containing ectopic gestation. Small normal appearing uterus, normal right fallopian tube, right ovary and left ovary. Postop course notable for gradual reequilibration of hgb with transfusion of 3 units prbc on POD 1 for hgb that reached 5.1, transfused and postop hgb 8.0 then 7.7 Discharge Exam: Blood pressure 97/56, pulse 76, temperature 98.3 F (36.8 C), temperature source Oral, resp. rate 18, last menstrual period 06/07/2015, SpO2 100 %. General appearance: alert, appears stated age and no distress Head: Normocephalic, without obvious abnormality, atraumatic Resp: unlabored GI: normal findings: soft, non-tender and incisions clean dry  Disposition: 01-Home or Self Care  Discharge Instructions    Call MD for:   persistant nausea and vomiting    Complete by:  As directed      Call MD for:  redness, tenderness, or signs of infection (pain, swelling, redness, odor or green/yellow discharge around incision site)    Complete by:  As directed      Call MD for:  temperature >100.4    Complete by:  As directed      Call MD for:    Complete by:  As directed   Call [phone] for a follow up appointment . Expect a normal to heavy menstrual period to begin this weekend. Take an  Iron supplement to allow your blood counts to continue to recover. You will be out of work 4-6 weeks.     Diet - low sodium heart healthy    Complete by:  As directed      Discharge instructions    Complete by:  As directed   1. Expect gradual reduction in abdominal pain. Be sure to take the Toradol regularly every 6 hours , and add the Percocet as needed to control the pain. Pain will not be completely eliminated. Local heating pad x 20 minutes every 2 hours to the incision area may provide some comfort. Leave dressing in place x 5 day, as long as it is intact. May shower with intact dressing in place.     Increase activity slowly    Complete by:  As directed             Medication List    TAKE these medications        albuterol 108 (90 Base) MCG/ACT inhaler  Commonly known as:  PROVENTIL HFA;VENTOLIN HFA  Inhale 2 puffs into the lungs every 6 (six) hours as  needed. For shortness of breath.     docusate sodium 100 MG capsule  Commonly known as:  COLACE  Take 1 capsule (100 mg total) by mouth 2 (two) times daily as needed for mild constipation.     ketorolac 10 MG tablet  Commonly known as:  TORADOL  Take 1 tablet (10 mg total) by mouth every 6 (six) hours as needed.     oxyCODONE-acetaminophen 7.5-325 MG tablet  Commonly known as:  PERCOCET  Take 1-2 tablets by mouth every 6 (six) hours as needed for severe pain. Do not take take 2 tablets at a time past 7/24. Constipation can be a complication of excess use.            Follow-up Information    Follow up with [facility].   Specialty:  Obstetrics and Gynecology   Why:  Postoperative visit   Contact information:   [address] [id number] mc [place] [address] [phone]      Signed: Tilda Burrow 08/14/2015, 5:48 AM

## 2015-08-14 NOTE — Discharge Instructions (Signed)
Ruptured Ectopic Pregnancy °An ectopic pregnancy is when the fertilized egg attaches (implants) outside the uterus. Most ectopic pregnancies occur in the fallopian tube. Rarely do ectopic pregnancies occur on the ovary, intestine, pelvis, or cervix. An ectopic pregnancy does not have the ability to develop into a normal, healthy baby.  °A ruptured ectopic pregnancy is one in which the fallopian tube gets torn or bursts and results in internal bleeding. Often there is intense abdominal pain, and sometimes, vaginal bleeding. Having an ectopic pregnancy can be a life-threatening experience. If left untreated, this dangerous condition can lead to a blood transfusion, abdominal surgery, or even death.  °CAUSES  °Damage to the fallopian tubes is the suspected cause in most ectopic pregnancies.  °RISK FACTORS °Depending on your circumstances, the amount of risk of having an ectopic pregnancy will vary. There are 3 categories that may help you identify whether you are potentially at risk. °High Risk °· You have gone through infertility treatment. °· You have had a previous ectopic pregnancy. °· You have had previous tubal surgery. °· You have had previous surgery to have the fallopian tubes tied (tubal ligation). °· You have tubal problems or diseases. °· You have been exposed to DES. DES is a medicine that was used until 1971 and had effects on babies whose mothers took the medicine. °· You become pregnant while using an intrauterine device (IUD) for birth control.  °Moderate Risk °· You have a history of infertility. °· You have a history of a sexually transmitted infection (STI). °· You have a history of pelvic inflammatory disease (PID). °· You have scarring from endometriosis. °· You have multiple sexual partners. °· You smoke.  °Low Risk °· You have had previous pelvic surgery. °· You use vaginal douching. °· You became sexually active before 30 years of age. °SYMPTOMS °An ectopic pregnancy should be suspected in  anyone who has missed a period and has abdominal pain or bleeding. °· You may experience normal pregnancy symptoms, such as: °¨ Nausea. °¨ Tiredness. °¨ Breast tenderness. °· Symptoms that are not normal include: °¨ Pain with intercourse. °¨ Irregular vaginal bleeding or spotting. °¨ Cramping or pain on one side, or in the lower abdomen. °¨ Fast heartbeat. °¨ Passing out while having a bowel movement. °· Symptoms of a ruptured ectopic pregnancy and internal bleeding may include: °¨ Sudden, severe pain in the abdomen and pelvis. °¨ Dizziness or fainting. °¨ Pain in the shoulder area. °DIAGNOSIS  °Tests that may be performed include: °· A pregnancy test. °· An ultrasound. °· Testing the specific level of pregnancy hormone in the bloodstream. °· Taking a sample of uterus tissue (dilation and curettage, D&C). °· Surgery to perform a visual exam of the inside of the abdomen using a lighted tube (laparoscopy). °TREATMENT  °Laparoscopic surgery or abdominal surgery is recommended for a ruptured ectopic pregnancy.  °· The whole fallopian tube may need to be removed (salpingectomy). °· If the tube is not too damaged, the tube may be saved, and the pregnancy will be surgically removed. In time, the tube may still function. °· If you have lost a lot of blood, you may need a blood transfusion. °· You may receive a Rho (D) immune globulin shot if you are Rh negative and the father is Rh positive, or if you do not know the Rh type of the father. This is to prevent problems with any future pregnancy. °SEEK IMMEDIATE MEDICAL CARE IF:  °You have any symptoms of an ectopic or ruptured ectopic pregnancy. This   is a medical emergency. MAKE SURE YOU:  Understand these instructions.  Will watch your condition.  Will get help right away if you are not doing well or get worse.   This information is not intended to replace advice given to you by your health care provider. Make sure you discuss any questions you have with your health  care provider.   Document Released: 01/07/2000 Document Revised: 01/14/2013 Document Reviewed: 10/21/2012 Elsevier Interactive Patient Education 2016 [employer].     Exploratory Laparotomy, Adult, Care After Refer to this sheet in the next few weeks. These instructions provide you with information about caring for yourself after your procedure. Your health care provider may also give you more specific instructions. Your treatment has been planned according to current medical practices, but problems sometimes occur. Call your health care provider if you have any problems or questions after your procedure. WHAT TO EXPECT AFTER THE PROCEDURE After your procedure, it is typical to have:  Abdominal soreness.  Fatigue.  A sore throat from tubes in your throat.  A lack of appetite. HOME CARE INSTRUCTIONS Medicines  Take medicines only as directed by your health care provider.  Do not drive or operate heavy machinery while taking pain medicine. Incision Care  There are many different ways to close and cover an incision, including stitches (sutures), skin glue, and adhesive strips. Follow your health care provider's instructions about:  Incision care.  Bandage (dressing) changes and removal.  Incision closure removal.  Do not take showers or baths until your health care provider says that you can.  Check your incision area daily for signs of infection. Watch for:  Redness.  Tenderness.  Swelling.  Drainage. Activities  Do not lift anything that is heavier than 10 pounds (4.5 kg) until your health care provider says that it is safe.  Try to walk a little bit each day if your health care provider says that it is okay.  Ask your health care provider when you can start to do your usual activities again, such as driving, going back to work, and having sex. Eating and Drinking  You may eat what you usually eat. Include lots of whole grains, fruits, and vegetables in your  diet. This will help to prevent constipation.  Drink enough fluid to keep your urine clear or pale yellow. General Instructions  Keep all follow-up visits as directed by your health care provider. This is important. SEEK MEDICAL CARE IF:   You have a fever.  You have chills.  Your pain medicine is not helping.  You have constipation or diarrhea.  You have nausea or vomiting.  You have drainage, redness, swelling, or pain at your incision site. SEEK IMMEDIATE MEDICAL CARE IF:  Your pain is getting worse.  It has been more than 3 days since you been able to have a bowel movement.  You have ongoing (persistent) vomiting.  The edges of your incision open up.  You have warmth, tenderness, and swelling in your calf.  You have trouble breathing.  You have chest pain.   This information is not intended to replace advice given to you by your health care provider. Make sure you discuss any questions you have with your health care provider.   Document Released: 08/24/2003 Document Revised: 01/30/2014 Document Reviewed: 08/27/2013 Elsevier Interactive Patient Education [employer]

## 2015-08-14 NOTE — Progress Notes (Signed)
Patient complained of abdominal cramping, medicated. While in the bathroom voiding pt. passed some placental fragments about 6 inches long, stringy and dark red in color.  No active vaginal bleeding note. Will continue to monitor.

## 2015-08-16 ENCOUNTER — Encounter (HOSPITAL_COMMUNITY): Payer: Self-pay | Admitting: Obstetrics & Gynecology

## 2015-08-23 ENCOUNTER — Inpatient Hospital Stay (HOSPITAL_COMMUNITY)
Admission: AD | Admit: 2015-08-23 | Discharge: 2015-08-23 | Disposition: A | Discharge status: Home / Self Care | Payer: Self-pay | Source: Ambulatory Visit | Attending: Obstetrics and Gynecology | Admitting: Obstetrics and Gynecology

## 2015-08-23 ENCOUNTER — Encounter (HOSPITAL_COMMUNITY): Payer: Self-pay | Admitting: *Deleted

## 2015-08-23 DIAGNOSIS — G8918 Other acute postprocedural pain: Secondary | ICD-10-CM

## 2015-08-23 DIAGNOSIS — F17210 Nicotine dependence, cigarettes, uncomplicated: Secondary | ICD-10-CM | POA: Insufficient documentation

## 2015-08-23 DIAGNOSIS — O009 Unspecified ectopic pregnancy without intrauterine pregnancy: Secondary | ICD-10-CM

## 2015-08-23 LAB — CBC WITH DIFFERENTIAL/PLATELET
BASOS ABS: 0 10*3/uL (ref 0.0–0.1)
BASOS PCT: 0 %
Eosinophils Absolute: 0.3 10*3/uL (ref 0.0–0.7)
Eosinophils Relative: 4 %
HEMATOCRIT: 32.7 % — AB (ref 36.0–46.0)
Hemoglobin: 10.6 g/dL — ABNORMAL LOW (ref 12.0–15.0)
LYMPHS ABS: 2.5 10*3/uL (ref 0.7–4.0)
Lymphocytes Relative: 29 %
MCH: 25.4 pg — AB (ref 26.0–34.0)
MCHC: 32.4 g/dL (ref 30.0–36.0)
MCV: 78.4 fL (ref 78.0–100.0)
MONO ABS: 0.5 10*3/uL (ref 0.1–1.0)
Monocytes Relative: 6 %
NEUTROS ABS: 5.2 10*3/uL (ref 1.7–7.7)
NEUTROS PCT: 61 %
OTHER: 0 %
PLATELETS: 489 10*3/uL — AB (ref 150–400)
RBC: 4.17 MIL/uL (ref 3.87–5.11)
RDW: 16.4 % — AB (ref 11.5–15.5)
WBC: 8.5 10*3/uL (ref 4.0–10.5)

## 2015-08-23 LAB — URINALYSIS, ROUTINE W REFLEX MICROSCOPIC
BILIRUBIN URINE: NEGATIVE
GLUCOSE, UA: NEGATIVE mg/dL
HGB URINE DIPSTICK: NEGATIVE
KETONES UR: NEGATIVE mg/dL
Leukocytes, UA: NEGATIVE
Nitrite: NEGATIVE
PH: 6 (ref 5.0–8.0)
PROTEIN: NEGATIVE mg/dL
Specific Gravity, Urine: 1.01 (ref 1.005–1.030)

## 2015-08-23 LAB — POCT PREGNANCY, URINE: Preg Test, Ur: POSITIVE — AB

## 2015-08-23 MED ORDER — FERROUS SULFATE 325 (65 FE) MG PO TABS: 325.0000 mg | ORAL_TABLET | Freq: Two times a day (BID) | ORAL | 1 refills | Status: DC

## 2015-08-23 NOTE — MAU Note (Signed)
Had surgery on the 19th for ectopic preg.  Is purple at belly button. Having pain at the same place

## 2015-08-23 NOTE — MAU Note (Signed)
Received a blood transfusion, thinks she may need more. Tried to call clinic # on d/c instructions for appt.  No one has called her. Still has dressing on

## 2015-08-23 NOTE — Discharge Instructions (Signed)
Incision Care °An incision is when a surgeon cuts into your body. After surgery, the incision needs to be cared for properly to prevent infection.  °HOW TO CARE FOR YOUR INCISION °· Take medicines only as directed by your health care provider. °· There are many different ways to close and cover an incision, including stitches, skin glue, and adhesive strips. Follow your health care provider's instructions on: °¨ Incision care. °¨ Bandage (dressing) changes and removal. °¨ Incision closure removal. °· Do not take baths, swim, or use a hot tub until your health care provider approves. You may shower as directed by your health care provider. °· Resume your normal diet and activities as directed. °· Use anti-itch medicine (such as an antihistamine) as directed by your health care provider. The incision may itch while it is healing. Do not pick or scratch at the incision. °· Drink enough fluid to keep your urine clear or pale yellow. °SEEK MEDICAL CARE IF:  °· You have drainage, redness, swelling, or pain at your incision site. °· You have muscle aches, chills, or a general ill feeling. °· You notice a bad smell coming from the incision or dressing. °· Your incision edges separate after the sutures, staples, or skin adhesive strips have been removed. °· You have persistent nausea or vomiting. °· You have a fever. °· You are dizzy. °SEEK IMMEDIATE MEDICAL CARE IF:  °· You have a rash. °· You faint. °· You have difficulty breathing. °MAKE SURE YOU:  °· Understand these instructions. °· Will watch your condition. °· Will get help right away if you are not doing well or get worse. °  °This information is not intended to replace advice given to you by your health care provider. Make sure you discuss any questions you have with your health care provider. °  °Document Released: 07/29/2004 Document Revised: 01/30/2014 Document Reviewed: 03/05/2013 °Elsevier Interactive Patient Education ©2016 Elsevier Inc. ° °

## 2015-08-23 NOTE — MAU Provider Note (Signed)
Chief Complaint: No chief complaint on file.   None     SUBJECTIVE HPI: Gail Weber is a 30 y.o. [id number] who presents to maternity admissions reporting pain at incision sites and purple skin below umbilical incision.  She had emergency surgery for removal of cornual ectopic pregnancy on 08/11/15.  She was discharged from the hospital on 7/22 but did not have a follow up visit scheduled at that time. She reports calling the number given but no one answered to make a follow up appointment.  She reports abdominal pain at her incisions with activity but no pain at rest.  She last took her prescribed Percocet 2-3 days ago and this did help with her pain.   She denies vaginal bleeding, vaginal itching/burning, urinary symptoms, h/a, dizziness, n/v, or fever/chills.     HPI  Past Medical History:  Diagnosis Date  . Asthma    Past Surgical History:  Procedure Laterality Date  . LAPAROSCOPY N/A 08/11/2015   Procedure: LAPAROSCOPY DIAGNOSTIC;  Surgeon: Adam Phenix, MD;  Location: [facility];  Service: Gynecology;  Laterality: N/A;  . LAPAROTOMY  08/11/2015   Procedure: LAPAROTOMY WITH EXCISION INTERSTITIAL ECTOPIC RIGHT;  Surgeon: Adam Phenix, MD;  Location: [facility];  Service: Gynecology;;  . NO PAST SURGERIES     Social History   Social History  . Marital status: Single    Spouse name: N/A  . Number of children: N/A  . Years of education: N/A   Occupational History  . Not on file.   Social History Main Topics  . Smoking status: Current Every Day Smoker    Packs/day: 0.50    Years: 5.00    Types: Cigarettes  . Smokeless tobacco: Current User  . Alcohol use No  . Drug use: No  . Sexual activity: Yes    Birth control/ protection: Condom   Other Topics Concern  . Not on file   Social History Narrative  . No narrative on file   No current facility-administered medications on file prior to encounter.    Current Outpatient Prescriptions on File Prior to Encounter  Medication  Sig Dispense Refill  . albuterol (PROVENTIL HFA;VENTOLIN HFA) 108 (90 BASE) MCG/ACT inhaler Inhale 2 puffs into the lungs every 6 (six) hours as needed. For shortness of breath.    . docusate sodium (COLACE) 100 MG capsule Take 1 capsule (100 mg total) by mouth 2 (two) times daily as needed for mild constipation. 30 capsule 0  . ketorolac (TORADOL) 10 MG tablet Take 1 tablet (10 mg total) by mouth every 6 (six) hours as needed. 20 tablet 0  . oxyCODONE-acetaminophen (PERCOCET) 7.5-325 MG tablet Take 1-2 tablets by mouth every 6 (six) hours as needed for severe pain. Do not take take 2 tablets at a time past 7/24. Constipation can be a complication of excess use. 30 tablet 0   Allergies  Allergen Reactions  . Latex Hives    ROS:  Review of Systems  Constitutional: Negative for chills, fatigue and fever.  Respiratory: Negative for shortness of breath.   Cardiovascular: Negative for chest pain.  Gastrointestinal: Positive for abdominal pain. Negative for constipation, diarrhea, nausea and vomiting.  Genitourinary: Negative for difficulty urinating, dysuria, flank pain, pelvic pain, vaginal bleeding, vaginal discharge and vaginal pain.  Skin: Positive for color change and wound.  Neurological: Positive for headaches. Negative for dizziness.  Psychiatric/Behavioral: Negative.      I have reviewed patient's Past Medical Hx, Surgical Hx, Family Hx, Social Hx, medications  and allergies.   Physical Exam  Patient Vitals for the past 24 hrs:  BP Temp Temp src Pulse Resp  08/23/15 1408 114/67 98.2 F (36.8 C) Oral 72 18   Constitutional: Well-developed, well-nourished female in no acute distress.  Cardiovascular: normal rate Respiratory: normal effort GI: Abd soft, non-tender. Pos BS x 4 MS: Extremities nontender, no edema, normal ROM Neurologic: Alert and oriented x 4.  GU: Neg CVAT. Skin: Honeycomb dressings still present and well adhered to both low transverse incision and umbilical  incision.  Removed both dressings and incisions both well-approximated and without edema, or exudate, ecchymosis, 3 cm area below umbilicus, also palpable small hard area under ecchymosis, not painful to palpation PELVIC EXAM: Deferred   LAB RESULTS Results for orders placed or performed during the hospital encounter of 08/23/15 (from the past 24 hour(s))  CBC with Differential/Platelet     Status: Abnormal   Collection Time: 08/23/15  2:17 PM  Result Value Ref Range   WBC 8.5 4.0 - 10.5 K/uL   RBC 4.17 3.87 - 5.11 MIL/uL   Hemoglobin 10.6 (L) 12.0 - 15.0 g/dL   HCT 40.9 (L) 81.1 - 91.4 %   MCV 78.4 78.0 - 100.0 fL   MCH 25.4 (L) 26.0 - 34.0 pg   MCHC 32.4 30.0 - 36.0 g/dL   RDW 78.2 (H) 95.6 - 21.3 %   Platelets 489 (H) 150 - 400 K/uL   Neutrophils Relative % 61 %   Lymphocytes Relative 29 %   Monocytes Relative 6 %   Eosinophils Relative 4 %   Basophils Relative 0 %   Other 0 %   Neutro Abs 5.2 1.7 - 7.7 K/uL   Lymphs Abs 2.5 0.7 - 4.0 K/uL   Monocytes Absolute 0.5 0.1 - 1.0 K/uL   Eosinophils Absolute 0.3 0.0 - 0.7 K/uL   Basophils Absolute 0.0 0.0 - 0.1 K/uL   RBC Morphology SCHISTOCYTES NOTED ON SMEAR   Urinalysis, Routine w reflex microscopic (not at [facility])     Status: None   Collection Time: 08/23/15  3:02 PM  Result Value Ref Range   Color, Urine YELLOW YELLOW   APPearance CLEAR CLEAR   Specific Gravity, Urine 1.010 1.005 - 1.030   pH 6.0 5.0 - 8.0   Glucose, UA NEGATIVE NEGATIVE mg/dL   Hgb urine dipstick NEGATIVE NEGATIVE   Bilirubin Urine NEGATIVE NEGATIVE   Ketones, ur NEGATIVE NEGATIVE mg/dL   Protein, ur NEGATIVE NEGATIVE mg/dL   Nitrite NEGATIVE NEGATIVE   Leukocytes, UA NEGATIVE NEGATIVE  Pregnancy, urine POC     Status: Abnormal   Collection Time: 08/23/15  3:07 PM  Result Value Ref Range   Preg Test, Ur POSITIVE (A) NEGATIVE    --/--/A POS, A POS (07/19 1304)  IMAGING US Ob Comp Less 14 Wks  Result Date: 08/11/2015 EXAM: OBSTETRIC <14 WK [place] AND  TRANSVAGINAL OB US TECHNIQUE: Both transabdominal and transvaginal ultrasound examinations were performed for complete evaluation of the gestation as well as the maternal uterus, adnexal regions, and pelvic cul-de-sac. Transvaginal technique was performed to assess early pregnancy. COMPARISON:  None. FINDINGS: Intrauterine gestational sac: None Right adnexal mass with a cystic structure consistent with a gestational sac. Right adnexal gestational sac contains the yolk sac, embryo without cardiac activity. Crown-rump length measures 9 mm dating the pregnancy is 6 weeks 6 days. Subchorionic hemorrhage:  None visualized. Maternal uterus/adnexae: Neither ovary is visualized. No pelvic free fluid. IMPRESSION: Right adnexal ectopic pregnancy without fetal heart rate  detected consistent with fetal demise. Critical Value/emergent results were called by telephone at the time of interpretation on 08/11/2015 at 12:23 pm to Dr. Shaune Pollack , who verbally acknowledged these results. Electronically Signed   By: Elige Ko   On: 08/11/2015 12:27    MAU Management/MDM: Ordered labs and reviewed results.  Consult Dr Jolayne Panther.  Bruising/ecchymosis and hard nodule where sutures in place are normal following the pt surgical procedure.  No evidence of acute abdomen.  Message sent to [facility] to set up f/u appointment for hcg and evaluation in 1 week. Pt stable at time of discharge.  ASSESSMENT 1. Post-operative pain   2. Ruptured ectopic pregnancy     PLAN Discharge home  F/U in [facility] in 1 week for hcg and evaluation Today's quant hcg pending    Medication List    STOP taking these medications   ketorolac 10 MG tablet Commonly known as:  TORADOL     TAKE these medications   albuterol 108 (90 Base) MCG/ACT inhaler Commonly known as:  PROVENTIL HFA;VENTOLIN HFA Inhale 2 puffs into the lungs every 6 (six) hours as needed. For shortness of breath.   docusate sodium 100 MG capsule Commonly known as:  COLACE Take 1  capsule (100 mg total) by mouth 2 (two) times daily as needed for mild constipation.   ferrous sulfate 325 (65 FE) MG tablet Commonly known as:  FERROUSUL Take 1 tablet (325 mg total) by mouth 2 (two) times daily.   oxyCODONE-acetaminophen 7.5-325 MG tablet Commonly known as:  PERCOCET Take 1-2 tablets by mouth every 6 (six) hours as needed for severe pain. Do not take take 2 tablets at a time past 7/24. Constipation can be a complication of excess use.      Follow-up Information    [facility] .   Specialty:  Obstetrics and Gynecology Why:  The clinic will call you with follow up appointment in 1 week.  Return to MAU as needed for emergencies. Contact information: [address] [place] [address] [phone]          Sharen Counter Certified Nurse-Midwife 08/23/2015  4:07 PM

## 2015-08-30 ENCOUNTER — Encounter: Payer: Self-pay | Admitting: Obstetrics & Gynecology

## 2016-02-11 ENCOUNTER — Emergency Department (HOSPITAL_COMMUNITY)
Admission: EM | Admit: 2016-02-11 | Discharge: 2016-02-12 | Disposition: A | Discharge status: Home / Self Care | Payer: Self-pay | Attending: Emergency Medicine | Admitting: Emergency Medicine

## 2016-02-11 ENCOUNTER — Encounter (HOSPITAL_COMMUNITY): Payer: Self-pay | Admitting: Emergency Medicine

## 2016-02-11 DIAGNOSIS — T781XXA Other adverse food reactions, not elsewhere classified, initial encounter: Secondary | ICD-10-CM | POA: Insufficient documentation

## 2016-02-11 DIAGNOSIS — J45909 Unspecified asthma, uncomplicated: Secondary | ICD-10-CM | POA: Insufficient documentation

## 2016-02-11 DIAGNOSIS — F17210 Nicotine dependence, cigarettes, uncomplicated: Secondary | ICD-10-CM | POA: Insufficient documentation

## 2016-02-11 DIAGNOSIS — Z91040 Latex allergy status: Secondary | ICD-10-CM | POA: Insufficient documentation

## 2016-02-11 DIAGNOSIS — R220 Localized swelling, mass and lump, head: Secondary | ICD-10-CM

## 2016-02-11 DIAGNOSIS — Z91013 Allergy to seafood: Secondary | ICD-10-CM | POA: Insufficient documentation

## 2016-02-11 MED ORDER — DIPHENHYDRAMINE HCL 25 MG PO CAPS
50.0000 mg | ORAL_CAPSULE | Freq: Once | ORAL | Status: AC
Start: 2016-02-11 — End: 2016-02-11
  Administered 2016-02-11: 50 mg via ORAL
  Filled 2016-02-11: qty 2

## 2016-02-11 NOTE — Discharge Instructions (Signed)
AVOID LOBSTER IN THE FUTURE!!! If you feel you are getting some localized swelling to your lip again, take benadryl 50 mg OTC. Return to the ED if you feel you are getting swelling in your throat, have difficulty breathing, swallowing, or you get swelling of your tongue your a lot of swelling to your lips, return to the ED.

## 2016-02-11 NOTE — ED Triage Notes (Signed)
Pt presents to ED for assessment after eating lobster this evening and experiencing a swollen lip, SOB and difficulty swallowing.  Pt denies any known history of allergy.

## 2016-02-11 NOTE — ED Provider Notes (Signed)
[facility]-EMERGENCY DEPT Provider Note   CSN: [id number] Arrival date & time: 02/11/16  2140  By signing my name below, I, Modena JanskyAlbert Thayil, attest that this documentation has been prepared under the direction and in the presence of Devoria AlbeIva Pierrette Scheu, MD . Electronically Signed: Modena JanskyAlbert Thayil, Scribe. 02/11/2016. 11:02 PM.  Time seen 23:04 PM  History   Chief Complaint Chief Complaint  Patient presents with  . Allergic Reaction    The history is provided by the patient. No language interpreter was used.   HPI Comments: Gail Weber is a 31 y.o. female with a PMHx of asthma (controlled) who presents to the Emergency Department complaining of an allergic reaction that started about 3 hours ago at 8 pm. She states she had associated right lower lip and tongue swelling and itching about 20 minutes after eating lobster. During episodes she panicked, had SOB, She states she feels back to normal now, the episode lasted ~45-60  minutes. No treatment PTA. No prior hx of prior lobster exposure or hx of other food allergies. No known family history of seafood allergy.  She is currently at baseline. She admits to smoking 4 cigs a day.. She denies any skin rash, itching, throat swelling, difficulty swallowing or other complaints.     PCP: Kathreen CosierMARSHALL,BERNARD A, MD (Inactive)  Past Medical History:  Diagnosis Date  . Asthma     Patient Active Problem List   Diagnosis Date Noted  . Ectopic pregnancy 08/11/2015  . Ruptured ectopic pregnancy   . ANEMIA-NOS 09/11/2006  . ASTHMA 09/11/2006    Past Surgical History:  Procedure Laterality Date  . LAPAROSCOPY N/A 08/11/2015   Procedure: LAPAROSCOPY DIAGNOSTIC;  Surgeon: Adam PhenixJames G Arnold, MD;  Location: [facility];  Service: Gynecology;  Laterality: N/A;  . LAPAROTOMY  08/11/2015   Procedure: LAPAROTOMY WITH EXCISION INTERSTITIAL ECTOPIC RIGHT;  Surgeon: Adam PhenixJames G Arnold, MD;  Location: [facility];  Service: Gynecology;;  . NO PAST SURGERIES      OB History    Gravida Para  Term Preterm AB Living   3 1 1   2 1    SAB TAB Ectopic Multiple Live Births   1   1          Obstetric Comments   Right INTERSTITIAL ECTOPIC removed surgically 08/11/15.  Right tube preserved according to op note. Pt informed that future right ectopic a possibility.       Home Medications    Prior to Admission medications   Medication Sig Start Date End Date Taking? Authorizing Provider  acetaminophen (TYLENOL) 500 MG tablet Take 1,000 mg by mouth every 6 (six) hours as needed for mild pain.   Yes Historical Provider, MD  albuterol (PROVENTIL HFA;VENTOLIN HFA) 108 (90 BASE) MCG/ACT inhaler Inhale 2 puffs into the lungs every 6 (six) hours as needed. For shortness of breath.   Yes Historical Provider, MD  docusate sodium (COLACE) 100 MG capsule Take 1 capsule (100 mg total) by mouth 2 (two) times daily as needed for mild constipation. Patient not taking: Reported on 02/11/2016 08/14/15   Tilda BurrowJohn Ferguson V, MD  ferrous sulfate (FERROUSUL) 325 (65 FE) MG tablet Take 1 tablet (325 mg total) by mouth 2 (two) times daily. Patient not taking: Reported on 02/11/2016 08/23/15   Wilmer FloorLisa A Leftwich-Kirby, CNM  oxyCODONE-acetaminophen (PERCOCET) 7.5-325 MG tablet Take 1-2 tablets by mouth every 6 (six) hours as needed for severe pain. Do not take take 2 tablets at a time past 7/24. Constipation can be a complication of excess  use. Patient not taking: Reported on 02/11/2016 08/14/15   Tilda Burrow, MD    Family History Family History  Problem Relation Age of Onset  . Heart murmur Mother   . Mental illness Mother   . Heart murmur Brother     Social History Social History  Substance Use Topics  . Smoking status: Current Every Day Smoker    Packs/day: 0.25    Years: 5.00    Types: Cigarettes  . Smokeless tobacco: Current User  . Alcohol use No  employed   Allergies   Latex   Review of Systems Review of Systems  All other systems reviewed and are negative.  A complete 10 system review of  systems was obtained and all systems are negative except as noted in the HPI and PMH.   Physical Exam Updated Vital Signs BP 117/73 (BP Location: Right Arm)   Pulse 83   Temp 98.8 F (37.1 C) (Oral)   Resp 16   Ht 5\' 7"  (1.702 m)   Wt 217 lb 1 oz (98.5 kg)   LMP 01/20/2016   SpO2 100%   BMI 34.00 kg/m   Vital signs normal    Physical Exam  Constitutional: She is oriented to person, place, and time. She appears well-developed and well-nourished.  Non-toxic appearance. She does not appear ill. No distress.  HENT:  Head: Normocephalic and atraumatic.  Right Ear: External ear normal.  Left Ear: External ear normal.  Nose: Nose normal. No mucosal edema or rhinorrhea.  Mouth/Throat: Oropharynx is clear and moist and mucous membranes are normal. No dental abscesses or uvula swelling.  Right lower lip: Small area appears swollen (the skin is shinier) at the level of canine tooth. Pt feels her lip is back to normal.   Eyes: Conjunctivae and EOM are normal. Pupils are equal, round, and reactive to light.  Neck: Normal range of motion and full passive range of motion without pain. Neck supple.  Cardiovascular: Normal rate, regular rhythm and normal heart sounds.  Exam reveals no gallop and no friction rub.   No murmur heard. Pulmonary/Chest: Effort normal and breath sounds normal. No respiratory distress. She has no wheezes. She has no rhonchi. She has no rales. She exhibits no tenderness and no crepitus.  Abdominal: Normal appearance.  Musculoskeletal: Normal range of motion. She exhibits no edema or tenderness.  Moves all extremities well.   Neurological: She is alert and oriented to person, place, and time. She has normal strength. No cranial nerve deficit.  Skin: Skin is warm, dry and intact. No rash noted. No erythema. No pallor.  Psychiatric: She has a normal mood and affect. Her speech is normal and behavior is normal. Her mood appears not anxious.  Nursing note and vitals  reviewed.    ED Treatments / Results  DIAGNOSTIC STUDIES: Oxygen Saturation is 100% on RA, normal by my interpretation.     Procedures Procedures (including critical care time)  Medications Ordered in ED Medications  diphenhydrAMINE (BENADRYL) capsule 50 mg (50 mg Oral Given 02/11/16 2315)     Initial Impression / Assessment and Plan / ED Course  I have reviewed the triage vital signs and the nursing notes.  Pertinent labs & imaging results that were available during my care of the patient were reviewed by me and considered in my medical decision making (see chart for details).    COORDINATION OF CARE: 11:06 PM- Pt advised of plan for treatment and pt agrees. Pt was given benadryl, she has  a driver. We discussed avoiding lobster in the future because the reaction could be worse. If she feels like her lip swells again she can take Benadryl over-the-counter. However she starts feeling she has swelling in her throat or has difficulty breathing or swallowing or gets swelling of her tongue she should return to the ED.  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 The recorded information has been reviewed and considered.  Devoria Albe, MD, Concha Pyo, MD 02/11/16 [phone]

## 2016-05-23 ENCOUNTER — Encounter (HOSPITAL_COMMUNITY): Payer: Self-pay | Admitting: Emergency Medicine

## 2016-05-23 ENCOUNTER — Emergency Department (HOSPITAL_COMMUNITY)
Admission: EM | Admit: 2016-05-23 | Discharge: 2016-05-23 | Discharge status: Against Medical Advice | Payer: No Typology Code available for payment source | Attending: Emergency Medicine | Admitting: Emergency Medicine

## 2016-05-23 DIAGNOSIS — Z79899 Other long term (current) drug therapy: Secondary | ICD-10-CM | POA: Insufficient documentation

## 2016-05-23 DIAGNOSIS — Y92410 Unspecified street and highway as the place of occurrence of the external cause: Secondary | ICD-10-CM | POA: Insufficient documentation

## 2016-05-23 DIAGNOSIS — Z5321 Procedure and treatment not carried out due to patient leaving prior to being seen by health care provider: Secondary | ICD-10-CM | POA: Insufficient documentation

## 2016-05-23 DIAGNOSIS — S01111A Laceration without foreign body of right eyelid and periocular area, initial encounter: Secondary | ICD-10-CM | POA: Insufficient documentation

## 2016-05-23 DIAGNOSIS — Y999 Unspecified external cause status: Secondary | ICD-10-CM | POA: Insufficient documentation

## 2016-05-23 DIAGNOSIS — Y939 Activity, unspecified: Secondary | ICD-10-CM | POA: Insufficient documentation

## 2016-05-23 DIAGNOSIS — J45909 Unspecified asthma, uncomplicated: Secondary | ICD-10-CM | POA: Diagnosis not present

## 2016-05-23 DIAGNOSIS — F17210 Nicotine dependence, cigarettes, uncomplicated: Secondary | ICD-10-CM | POA: Diagnosis not present

## 2016-05-23 MED ORDER — ALBUTEROL SULFATE (2.5 MG/3ML) 0.083% IN NEBU
INHALATION_SOLUTION | RESPIRATORY_TRACT | Status: AC
  Administered 2016-05-23: 5 mg
  Filled 2016-05-23: qty 6

## 2016-05-23 NOTE — ED Notes (Signed)
Pt stated she wanted to leave due to not wanting to wait. Pt O2 sat is 100%. Pt stated she had family at [facility] who will get her in with no wait.

## 2016-05-23 NOTE — ED Notes (Signed)
Patient causing disturbance in waiting room.  Patient asked to leave due to her disturbance.  Patient escorted off property.
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
## 2016-05-23 NOTE — ED Notes (Signed)
Patient complaining of needing some air or an inhaler for her asthma.  She is having some anxiety with her shortness of breath.  Patient yelling full sentences in waiting room, yelling that "I need some air".  Patient escorted by myself into triage, patient given one albuterol treatment.  I explained to patient that if she has an outburst like she did in the waiting room again, she will be asked to leave and escorted off the property.  She stated that she understood, apologized for her actions at that moment.  Patient was placed back in waiting room after her breathing treatment.

## 2016-10-29 ENCOUNTER — Emergency Department (HOSPITAL_COMMUNITY)
Admission: EM | Admit: 2016-10-29 | Discharge: 2016-10-29 | Disposition: A | Discharge status: Home / Self Care | Payer: Self-pay | Attending: Emergency Medicine | Admitting: Emergency Medicine

## 2016-10-29 ENCOUNTER — Encounter (HOSPITAL_COMMUNITY): Payer: Self-pay | Admitting: Emergency Medicine

## 2016-10-29 DIAGNOSIS — J45909 Unspecified asthma, uncomplicated: Secondary | ICD-10-CM | POA: Insufficient documentation

## 2016-10-29 DIAGNOSIS — F17210 Nicotine dependence, cigarettes, uncomplicated: Secondary | ICD-10-CM | POA: Insufficient documentation

## 2016-10-29 DIAGNOSIS — Z91040 Latex allergy status: Secondary | ICD-10-CM | POA: Insufficient documentation

## 2016-10-29 DIAGNOSIS — F410 Panic disorder [episodic paroxysmal anxiety] without agoraphobia: Secondary | ICD-10-CM | POA: Insufficient documentation

## 2016-10-29 LAB — URINALYSIS, ROUTINE W REFLEX MICROSCOPIC
BACTERIA UA: NONE SEEN
Bilirubin Urine: NEGATIVE
GLUCOSE, UA: NEGATIVE mg/dL
Hgb urine dipstick: NEGATIVE
KETONES UR: NEGATIVE mg/dL
Nitrite: NEGATIVE
PROTEIN: NEGATIVE mg/dL
Specific Gravity, Urine: 1.017 (ref 1.005–1.030)
pH: 6 (ref 5.0–8.0)

## 2016-10-29 LAB — I-STAT BETA HCG BLOOD, ED (MC, WL, AP ONLY)

## 2016-10-29 LAB — COMPREHENSIVE METABOLIC PANEL
ALT: 11 U/L — AB (ref 14–54)
ANION GAP: 9 (ref 5–15)
AST: 19 U/L (ref 15–41)
Albumin: 3.7 g/dL (ref 3.5–5.0)
Alkaline Phosphatase: 47 U/L (ref 38–126)
BUN: 12 mg/dL (ref 6–20)
CALCIUM: 9.2 mg/dL (ref 8.9–10.3)
CHLORIDE: 107 mmol/L (ref 101–111)
CO2: 23 mmol/L (ref 22–32)
CREATININE: 0.81 mg/dL (ref 0.44–1.00)
GFR calc non Af Amer: >60 mL/min (ref >60)
Glucose, Bld: 87 mg/dL (ref 65–99)
Potassium: 3.4 mmol/L — ABNORMAL LOW (ref 3.5–5.1)
SODIUM: 139 mmol/L (ref 135–145)
TOTAL PROTEIN: 8 g/dL (ref 6.5–8.1)
Total Bilirubin: 0.4 mg/dL (ref 0.3–1.2)

## 2016-10-29 LAB — CBC WITH DIFFERENTIAL/PLATELET
BASOS PCT: 0 %
Basophils Absolute: 0 10*3/uL (ref 0.0–0.1)
EOS ABS: 0 10*3/uL (ref 0.0–0.7)
Eosinophils Relative: 1 %
HCT: 39 % (ref 36.0–46.0)
HEMOGLOBIN: 12.9 g/dL (ref 12.0–15.0)
LYMPHS ABS: 1.5 10*3/uL (ref 0.7–4.0)
LYMPHS PCT: 31 %
MCH: 24.5 pg — AB (ref 26.0–34.0)
MCHC: 33.1 g/dL (ref 30.0–36.0)
MCV: 74 fL — ABNORMAL LOW (ref 78.0–100.0)
MONO ABS: 0.3 10*3/uL (ref 0.1–1.0)
MONOS PCT: 6 %
NEUTROS ABS: 3.1 10*3/uL (ref 1.7–7.7)
NEUTROS PCT: 63 %
Platelets: 254 10*3/uL (ref 150–400)
RBC: 5.27 MIL/uL — ABNORMAL HIGH (ref 3.87–5.11)
RDW: 13.6 % (ref 11.5–15.5)
WBC: 4.9 10*3/uL (ref 4.0–10.5)

## 2016-10-29 LAB — I-STAT TROPONIN, ED: Troponin i, poc: 0 ng/mL (ref 0.00–0.08)

## 2016-10-29 MED ORDER — LORAZEPAM 2 MG/ML IJ SOLN
1.0000 mg | Freq: Once | INTRAMUSCULAR | Status: AC
  Administered 2016-10-29: 1 mg via INTRAVENOUS
  Filled 2016-10-29: qty 1

## 2016-10-29 MED ORDER — ALPRAZOLAM 0.25 MG PO TABS: 0.2500 mg | ORAL_TABLET | Freq: Two times a day (BID) | ORAL | 0 refills | Status: DC | PRN

## 2016-10-29 MED ORDER — SODIUM CHLORIDE 0.9 % IV BOLUS (SEPSIS)
1000.0000 mL | Freq: Once | INTRAVENOUS | Status: AC
  Administered 2016-10-29: 1000 mL via INTRAVENOUS

## 2016-10-29 NOTE — ED Provider Notes (Signed)
[facility]-EMERGENCY DEPT Provider Note   CSN: [id number] Arrival date & time: 10/29/16  1754     History   Chief Complaint Chief Complaint  Patient presents with  . Palpitations    HPI Noheli Macmurray is a 31 y.o. female history of asthma here presenting with palpitations, anxiety. Patient states that over the last 3-4 days, she has been having panic attacks. She states that she may be stressed out and then also and she had palpitations and she had to put cold water on her chest or else she started shaking all over. She states that she Is awake during these episodes. Patient denies thoughts of harming herself or others or hallucinations. Patient states that she has a history of anemia but denies any vaginal bleeding recently. Denies any weight loss or fevers.  The history is provided by the patient.    Past Medical History:  Diagnosis Date  . Asthma     Patient Active Problem List   Diagnosis Date Noted  . Ectopic pregnancy 08/11/2015  . Ruptured ectopic pregnancy   . ANEMIA-NOS 09/11/2006  . ASTHMA 09/11/2006    Past Surgical History:  Procedure Laterality Date  . LAPAROSCOPY N/A 08/11/2015   Procedure: LAPAROSCOPY DIAGNOSTIC;  Surgeon: Adam Phenix, MD;  Location: [facility];  Service: Gynecology;  Laterality: N/A;  . LAPAROTOMY  08/11/2015   Procedure: LAPAROTOMY WITH EXCISION INTERSTITIAL ECTOPIC RIGHT;  Surgeon: Adam Phenix, MD;  Location: [facility];  Service: Gynecology;;  . NO PAST SURGERIES      OB History    Gravida Para Term Preterm AB Living   SAB TAB Ectopic Multiple Live Births   1   1          Obstetric Comments   Right INTERSTITIAL ECTOPIC removed surgically 08/11/15.  Right tube preserved according to op note. Pt informed that future right ectopic a possibility.       Home Medications    Prior to Admission medications   Medication Sig Start Date End Date Taking? Authorizing Provider  acetaminophen (TYLENOL) 500 MG tablet Take 1,000 mg by  mouth every 6 (six) hours as needed for mild pain.    [provider]  albuterol (PROVENTIL HFA;VENTOLIN HFA) 108 (90 BASE) MCG/ACT inhaler Inhale 2 puffs into the lungs every 6 (six) hours as needed. For shortness of breath.    [provider]  docusate sodium (COLACE) 100 MG capsule Take 1 capsule (100 mg total) by mouth 2 (two) times daily as needed for mild constipation. Patient not taking: Reported on 02/11/2016 08/14/15   Tilda Burrow, MD  ferrous sulfate (FERROUSUL) 325 (65 FE) MG tablet Take 1 tablet (325 mg total) by mouth 2 (two) times daily. Patient not taking: Reported on 02/11/2016 08/23/15   Leftwich-Kirby, Wilmer Floor, CNM  oxyCODONE-acetaminophen (PERCOCET) 7.5-325 MG tablet Take 1-2 tablets by mouth every 6 (six) hours as needed for severe pain. Do not take take 2 tablets at a time past 7/24. Constipation can be a complication of excess use. Patient not taking: Reported on 02/11/2016 08/14/15   Tilda Burrow, MD    Family History Family History  Problem Relation Age of Onset  . Heart murmur Mother   . Mental illness Mother   . Heart murmur Brother     Social History Social History  Substance Use Topics  . Smoking status: Current Every Day Smoker    Packs/day: 0.25    Years: 5.00  Types: Cigarettes  . Smokeless tobacco: Current User  . Alcohol use No     Allergies   Latex   Review of Systems Review of Systems  Cardiovascular: Positive for palpitations.  All other systems reviewed and are negative.    Physical Exam Updated Vital Signs BP 115/67 (BP Location: Right Arm)   Pulse 77   Temp 98.4 F (36.9 C) (Oral)   Resp 18   Wt 106.1 kg (234 lb)   SpO2 99%   BMI 36.65 kg/m   Physical Exam  Constitutional: She is oriented to person, place, and time. She appears well-developed.  Slightly anxious   HENT:  Head: Normocephalic.  Mouth/Throat: Oropharynx is clear and moist.  Eyes: Pupils are equal, round, and reactive to light.  Conjunctivae and EOM are normal.  Neck: Normal range of motion. Neck supple.  Cardiovascular: Normal rate, regular rhythm and normal heart sounds.   Pulmonary/Chest: Effort normal and breath sounds normal.  Abdominal: Soft. Bowel sounds are normal. She exhibits no distension. There is no tenderness. There is no guarding.  Musculoskeletal: Normal range of motion. She exhibits no edema or deformity.  Neurological: She is alert and oriented to person, place, and time. She displays normal reflexes. No cranial nerve deficit. Coordination normal.  Skin: Skin is warm.  Psychiatric: She has a normal mood and affect.  Nursing note and vitals reviewed.    ED Treatments / Results  Labs (all labs ordered are listed, but only abnormal results are displayed) Labs Reviewed  CBC WITH DIFFERENTIAL/PLATELET  COMPREHENSIVE METABOLIC PANEL  URINALYSIS, ROUTINE W REFLEX MICROSCOPIC  I-STAT BETA HCG BLOOD, ED ([facility], [facility], [facility] ONLY)  I-STAT TROPONIN, ED    EKG  EKG Interpretation  Date/Time:  Sunday October 29 2016 18:27:56 EDT Ventricular Rate:  88 PR Interval:    QRS Duration: 85 QT Interval:  354 QTC Calculation: 429 R Axis:   55 Text Interpretation:  Sinus rhythm Baseline wander in lead(s) II III aVR aVF No significant change since last tracing Confirmed by Richardean Canal [phone]) on 10/29/2016 8:41:01 PM       Radiology No results found.  Procedures Procedures (including critical care time)  Medications Ordered in ED Medications  sodium chloride 0.9 % bolus 1,000 mL (not administered)  LORazepam (ATIVAN) injection 1 mg (not administered)     Initial Impression / Assessment and Plan / ED Course  I have reviewed the triage vital signs and the nursing notes.  Pertinent labs & imaging results that were available during my care of the patient were reviewed by me and considered in my medical decision making (see chart for details).     Tenya Landess is a 31 y.o. female here with  palpitations, anxiety, tremors. I think likely panic attacks. Not tachycardic. Well appearing. She is awake during those episodes and I doubt partial seizures. Will check labs. Will give ativan and reassess.   10:49 PM Labs unremarkable. Comfortably sleeping after ativan. Will dc home with xanax prn. Will have her follow up with PCP.    Final Clinical Impressions(s) / ED Diagnoses   Final diagnoses:  None    New Prescriptions New Prescriptions   No medications on file     Charlynne Pander, MD 10/29/16 2250

## 2016-10-29 NOTE — ED Triage Notes (Signed)
Pt reports she has had episodes of palpitations and generalized body shaking at home for the past 3 weeks. Last time this happened was last night.

## 2016-10-29 NOTE — Discharge Instructions (Signed)
Take xanax as needed for anxiety or panic attacks.   See your primary care doctor.   Return to ER if you have worse palpitations, chest pain, shortness of breath.

## 2016-12-05 ENCOUNTER — Emergency Department (HOSPITAL_COMMUNITY)
Admission: EM | Admit: 2016-12-05 | Discharge: 2016-12-06 | Disposition: A | Discharge status: Home / Self Care | Payer: Self-pay | Attending: Emergency Medicine | Admitting: Emergency Medicine

## 2016-12-05 ENCOUNTER — Encounter (HOSPITAL_COMMUNITY): Payer: Self-pay | Admitting: *Deleted

## 2016-12-05 DIAGNOSIS — Z79899 Other long term (current) drug therapy: Secondary | ICD-10-CM | POA: Insufficient documentation

## 2016-12-05 DIAGNOSIS — R450 Nervousness: Secondary | ICD-10-CM | POA: Insufficient documentation

## 2016-12-05 DIAGNOSIS — Y658 Other specified misadventures during surgical and medical care: Secondary | ICD-10-CM | POA: Insufficient documentation

## 2016-12-05 DIAGNOSIS — R0789 Other chest pain: Secondary | ICD-10-CM | POA: Insufficient documentation

## 2016-12-05 DIAGNOSIS — R002 Palpitations: Secondary | ICD-10-CM | POA: Insufficient documentation

## 2016-12-05 DIAGNOSIS — R1033 Periumbilical pain: Secondary | ICD-10-CM | POA: Insufficient documentation

## 2016-12-05 DIAGNOSIS — T8189XA Other complications of procedures, not elsewhere classified, initial encounter: Secondary | ICD-10-CM | POA: Insufficient documentation

## 2016-12-05 DIAGNOSIS — F17210 Nicotine dependence, cigarettes, uncomplicated: Secondary | ICD-10-CM | POA: Insufficient documentation

## 2016-12-05 DIAGNOSIS — J45909 Unspecified asthma, uncomplicated: Secondary | ICD-10-CM | POA: Insufficient documentation

## 2016-12-05 HISTORY — DX: Unspecified ectopic pregnancy without intrauterine pregnancy: O00.90

## 2016-12-05 LAB — CBC
HCT: 37.9 % (ref 36.0–46.0)
Hemoglobin: 12.5 g/dL (ref 12.0–15.0)
MCH: 24.5 pg — AB (ref 26.0–34.0)
MCHC: 33 g/dL (ref 30.0–36.0)
MCV: 74.3 fL — AB (ref 78.0–100.0)
PLATELETS: 373 10*3/uL (ref 150–400)
RBC: 5.1 MIL/uL (ref 3.87–5.11)
RDW: 13.5 % (ref 11.5–15.5)
WBC: 8.1 10*3/uL (ref 4.0–10.5)

## 2016-12-05 LAB — URINALYSIS, ROUTINE W REFLEX MICROSCOPIC
BILIRUBIN URINE: NEGATIVE
GLUCOSE, UA: NEGATIVE mg/dL
HGB URINE DIPSTICK: NEGATIVE
Ketones, ur: 5 mg/dL — AB
Leukocytes, UA: NEGATIVE
Nitrite: NEGATIVE
PROTEIN: NEGATIVE mg/dL
Specific Gravity, Urine: 1.021 (ref 1.005–1.030)
pH: 5 (ref 5.0–8.0)

## 2016-12-05 LAB — LIPASE, BLOOD: LIPASE: 28 U/L (ref 11–51)

## 2016-12-05 LAB — COMPREHENSIVE METABOLIC PANEL
ALK PHOS: 61 U/L (ref 38–126)
ALT: 13 U/L — AB (ref 14–54)
AST: 24 U/L (ref 15–41)
Albumin: 4.4 g/dL (ref 3.5–5.0)
Anion gap: 10 (ref 5–15)
BUN: 18 mg/dL (ref 6–20)
CALCIUM: 9.3 mg/dL (ref 8.9–10.3)
CHLORIDE: 106 mmol/L (ref 101–111)
CO2: 21 mmol/L — AB (ref 22–32)
CREATININE: 1.16 mg/dL — AB (ref 0.44–1.00)
GFR calc non Af Amer: >60 mL/min (ref >60)
GLUCOSE: 77 mg/dL (ref 65–99)
Potassium: 3.4 mmol/L — ABNORMAL LOW (ref 3.5–5.1)
SODIUM: 137 mmol/L (ref 135–145)
Total Bilirubin: 0.3 mg/dL (ref 0.3–1.2)
Total Protein: 9.8 g/dL — ABNORMAL HIGH (ref 6.5–8.1)

## 2016-12-05 LAB — I-STAT TROPONIN, ED: Troponin i, poc: 0 ng/mL (ref 0.00–0.08)

## 2016-12-05 LAB — POC URINE PREG, ED: PREG TEST UR: NEGATIVE

## 2016-12-05 MED ORDER — BACITRACIN ZINC 500 UNIT/GM EX OINT
TOPICAL_OINTMENT | CUTANEOUS | Status: AC
  Filled 2016-12-05: qty 1.8

## 2016-12-06 MED ORDER — SODIUM CHLORIDE 0.9 % IV BOLUS (SEPSIS): 1000.0000 mL | Freq: Once | INTRAVENOUS | Status: DC

## 2016-12-06 MED ORDER — GI COCKTAIL ~~LOC~~: 30.0000 mL | Freq: Once | ORAL | Status: DC

## 2016-12-06 MED ORDER — IBUPROFEN 800 MG PO TABS: 800.0000 mg | ORAL_TABLET | Freq: Once | ORAL | Status: DC

## 2016-12-06 NOTE — ED Provider Notes (Signed)
[facility]-EMERGENCY DEPT Provider Note   CSN: [id number] Arrival date & time: 12/05/16  1851     History   Chief Complaint Chief Complaint  Patient presents with  . Abdominal Pain    HPI Gail Weber is a 31 y.o. female.  HPI   Ms. Truax is a 31 year old female with a history of anxiety, panic attacks, ruptured ectopic pregnancy, asthma, gastritis who presents the emergency department for evaluation of cold sweats and naval drainage for the past 3 months.  Patient states that she [facility].  Since the procedure she has had intermittent clear and occasionally bloody drainage from the site.  She also reports pain over the incision site which is 8/10 in severity, constant and "stinging" in nature.  She denies fever, nausea/vomiting, diarrhea, vaginal discharge, dysuria, urinary frequency.  She also endorses intermittent periods of chills and palpitations in which she has to put cold ice water on her chest in order to relieve her symptoms.  Last episode of this was four days ago when she was feeling increasingly anxious.  She reports chest tightness, states that she has a history of asthma and this sensation occurs intermittently.  She denies chest pain, palpitations, shortness of breath, leg swelling, headaches, numbness/weakness at this time.   Past Medical History:  Diagnosis Date  . Asthma   . Ectopic pregnancy     Patient Active Problem List   Diagnosis Date Noted  . Ectopic pregnancy 08/11/2015  . Ruptured ectopic pregnancy   . ANEMIA-NOS 09/11/2006  . ASTHMA 09/11/2006    Past Surgical History:  Procedure Laterality Date  . ECTOPIC PREGNANCY SURGERY    . NO PAST SURGERIES      OB History    Gravida Para Term Preterm AB Living   3 1 1   2 1    SAB TAB Ectopic Multiple Live Births   1   1          Obstetric Comments   Right INTERSTITIAL ECTOPIC removed surgically 08/11/15.  Right tube  preserved according to op note. Pt informed that future right ectopic a possibility.       Home Medications    Prior to Admission medications   Medication Sig Start Date End Date Taking? Authorizing Provider  acetaminophen (TYLENOL) 500 MG tablet Take 1,000 mg by mouth every 6 (six) hours as needed for mild pain.   Yes [provider]  albuterol (PROVENTIL HFA;VENTOLIN HFA) 108 (90 BASE) MCG/ACT inhaler Inhale 2 puffs into the lungs every 6 (six) hours as needed. For shortness of breath.   Yes [provider]    Family History Family History  Problem Relation Age of Onset  . Heart murmur Mother   . Mental illness Mother   . Heart murmur Brother     Social History Social History   Tobacco Use  . Smoking status: Current Every Day Smoker    Packs/day: 0.25    Years: 5.00    Pack years: 1.25    Types: Cigarettes  . Smokeless tobacco: Current User  Substance Use Topics  . Alcohol use: No  . Drug use: No     Allergies   Latex   Review of Systems Review of Systems  Constitutional: Positive for chills. Negative for fatigue and fever.  Respiratory: Negative for shortness of breath.   Cardiovascular: Negative for chest pain.  Gastrointestinal: Positive for abdominal pain. Negative for diarrhea, nausea and vomiting.  Genitourinary:  Negative for difficulty urinating, dysuria, frequency and vaginal discharge.  Musculoskeletal: Negative for arthralgias.  Skin: Positive for wound (over the navel). Negative for rash.  Neurological: Negative for weakness, numbness and headaches.  Psychiatric/Behavioral: The patient is nervous/anxious.   All other systems reviewed and are negative.    Physical Exam Updated Vital Signs BP 109/79 (BP Location: Right Arm)   Pulse 73   Temp 98.9 F (37.2 C) (Oral)   Resp 16   SpO2 98%   Physical Exam  Constitutional: She is oriented to person, place, and time. She appears well-developed and well-nourished.  Non-toxic  appearance. She does not appear ill.  Patient sitting comfortably at the bedside in no acute distress.  HENT:  Head: Normocephalic and atraumatic.  Mouth/Throat: Oropharynx is clear and moist. No oropharyngeal exudate.  Mucous membranes moist.  No cervical adenopathy.  Eyes: Pupils are equal, round, and reactive to light. Right eye exhibits no discharge. Left eye exhibits no discharge. No scleral icterus.  Cardiovascular: Normal rate and regular rhythm. Exam reveals no friction rub.  No murmur heard. Pulmonary/Chest: Effort normal and breath sounds normal. No respiratory distress. She has no wheezes. She has no rales.  Abdominal: Soft. Bowel sounds are normal. She exhibits no distension and no mass.  Incision over navel approximately 1cm, scabbed over. No surrounding erythema, edema, warmth, induration. No drainage. It is non-tender to palpation. No abdominal tenderness, guarding or rigidity. No rebound tenderness. No CVA tenderness.  Musculoskeletal: Normal range of motion.  Neurological: She is alert and oriented to person, place, and time.  Skin: Skin is warm and dry. Capillary refill takes less than 2 seconds.  Psychiatric: She has a normal mood and affect. Her behavior is normal.  Nursing note and vitals reviewed.    ED Treatments / Results  Labs (all labs ordered are listed, but only abnormal results are displayed) Labs Reviewed  COMPREHENSIVE METABOLIC PANEL - Abnormal; Notable for the following components:      Result Value   Potassium 3.4 (*)    CO2 21 (*)    Creatinine, Ser 1.16 (*)    Total Protein 9.8 (*)    ALT 13 (*)    All other components within normal limits  CBC - Abnormal; Notable for the following components:   MCV 74.3 (*)    MCH 24.5 (*)    All other components within normal limits  URINALYSIS, ROUTINE W REFLEX MICROSCOPIC - Abnormal; Notable for the following components:   Ketones, ur 5 (*)    All other components within normal limits  LIPASE, BLOOD  POC  URINE PREG, ED  I-STAT TROPONIN, ED    EKG  EKG Interpretation  Date/Time:  Wednesday December 06 2016 00:43:18 EST Ventricular Rate:  70 PR Interval:    QRS Duration: 104 QT Interval:  397 QTC Calculation: 429 R Axis:   64 Text Interpretation:  Sinus rhythm Confirmed by Nicanor Alcon, April ([id number]) on 12/06/2016 12:45:32 AM       Radiology No results found.  Procedures Procedures (including critical care time)  Medications Ordered in ED Medications  bacitracin 500 UNIT/GM ointment (not administered)     Initial Impression / Assessment and Plan / ED Course  I have reviewed the triage vital signs and the nursing notes.  Pertinent labs & imaging results that were available during my care of the patient were reviewed by me and considered in my medical decision making (see chart for details).  Clinical Course as of Dec 07 139  Wed  Dec 06, 2016  0045 Reviewed lab work with patient at bedside. Discussed the importance of water rehydration, patient agrees. She is able to tolerate p.o. fluids at the bedside. She has no complaints prior to discharge.   [ES]    Clinical Course User Index [ES] Kellie ShropshireShrosbree, Suha Schoenbeck J, PA-C   Patient presents the emergency department for evaluation of naval drainage and intermittent chills over the past several months.  No drainage, warmth, erythema, induration, tenderness over the incision on exam. Do not suspect infection. It appears scabbed over and dry. Patient is afebrile, nontoxic-appearing and vital signs stable.    Basic labs are reviewed.  CBC is unremarkable.  UA without infection.  Lipase negative, do not suspect pancreatitis.  Urine pregnancy negative.  CMP reveals slight bump in creatinine (1.16 today versus 0.81 one month ago.)  Discussed this with patient and she states that she has not been drinking water today since she has been in the ER waiting room.  Counseled her to push fluids at home.  She is able to tolerate p.o. fluids at the bedside.   Patient agrees and voices understanding to this plan.  Troponin and EKG ordered given patient has "chest tightness."  She denies chest pain, shortness of breath, diaphoresis, nausea/vomiting at this time.  Doubt ACS given presentation.  EKG nonischemic and troponin negative.  Have discussed return precautions and patient agrees.   Counseled patient to apply lubricating lotion to the incision daily, as it appears that the skin is dry which is causing it to drain.  Patient agrees.  Discussed this patient with Dr. Nicanor AlconPalumbo who agrees with plan   Final Clinical Impressions(s) / ED Diagnoses   Final diagnoses:  Periumbilical abdominal pain    ED Discharge Orders    None       Lawrence MarseillesShrosbree, Cherese Lozano J, PA-C 12/06/16 0141    Palumbo, April, MD 12/06/16 [id number]

## 2016-12-06 NOTE — Discharge Instructions (Signed)
Your lab work was very reassuring today.  Your EKG was normal.  Please drink plenty of fluids when you get home and for the next several days.  You can apply over-the-counter lotion or Neosporin cream to your draining site over your bellybutton.  Your blood pressure was elevated in the ER today.  Please have this rechecked.  I have also listed the [facility] which is a clinic who accepts patients that do not have insurance.  Please call to establish care and also for follow up of general medical concerns.   Please return to the emergency department if you develop fever greater than 100.4 F with worsening redness and swelling at the bellybutton.  Please also return for any new or worsening symptoms.

## 2018-08-15 ENCOUNTER — Other Ambulatory Visit: Payer: Self-pay

## 2018-08-15 ENCOUNTER — Encounter (HOSPITAL_COMMUNITY): Payer: Self-pay | Admitting: *Deleted

## 2018-08-15 ENCOUNTER — Emergency Department (HOSPITAL_COMMUNITY)
Admission: EM | Admit: 2018-08-15 | Discharge: 2018-08-15 | Disposition: A | Discharge status: Home / Self Care | Payer: Self-pay | Attending: Emergency Medicine | Admitting: Emergency Medicine

## 2018-08-15 DIAGNOSIS — R109 Unspecified abdominal pain: Secondary | ICD-10-CM

## 2018-08-15 DIAGNOSIS — R252 Cramp and spasm: Secondary | ICD-10-CM | POA: Insufficient documentation

## 2018-08-15 DIAGNOSIS — J45909 Unspecified asthma, uncomplicated: Secondary | ICD-10-CM | POA: Insufficient documentation

## 2018-08-15 DIAGNOSIS — F17210 Nicotine dependence, cigarettes, uncomplicated: Secondary | ICD-10-CM | POA: Insufficient documentation

## 2018-08-15 DIAGNOSIS — F17220 Nicotine dependence, chewing tobacco, uncomplicated: Secondary | ICD-10-CM | POA: Insufficient documentation

## 2018-08-15 LAB — CBC
HCT: 36.3 % (ref 36.0–46.0)
Hemoglobin: 10.8 g/dL — ABNORMAL LOW (ref 12.0–15.0)
MCH: 22.9 pg — ABNORMAL LOW (ref 26.0–34.0)
MCHC: 29.8 g/dL — ABNORMAL LOW (ref 30.0–36.0)
MCV: 77.1 fL — ABNORMAL LOW (ref 80.0–100.0)
Platelets: 386 10*3/uL (ref 150–400)
RBC: 4.71 MIL/uL (ref 3.87–5.11)
RDW: 14.7 % (ref 11.5–15.5)
WBC: 7.5 10*3/uL (ref 4.0–10.5)
nRBC: 0 % (ref 0.0–0.2)

## 2018-08-15 LAB — URINALYSIS, ROUTINE W REFLEX MICROSCOPIC
Bacteria, UA: NONE SEEN
Bilirubin Urine: NEGATIVE
Glucose, UA: NEGATIVE mg/dL
Ketones, ur: NEGATIVE mg/dL
Nitrite: NEGATIVE
Protein, ur: NEGATIVE mg/dL
Specific Gravity, Urine: 1.023 (ref 1.005–1.030)
pH: 6 (ref 5.0–8.0)

## 2018-08-15 LAB — COMPREHENSIVE METABOLIC PANEL
ALT: 14 U/L (ref 0–44)
AST: 19 U/L (ref 15–41)
Albumin: 4.1 g/dL (ref 3.5–5.0)
Alkaline Phosphatase: 49 U/L (ref 38–126)
Anion gap: 12 (ref 5–15)
BUN: 16 mg/dL (ref 6–20)
CO2: 23 mmol/L (ref 22–32)
Calcium: 9.2 mg/dL (ref 8.9–10.3)
Chloride: 103 mmol/L (ref 98–111)
Creatinine, Ser: 0.95 mg/dL (ref 0.44–1.00)
GFR calc Af Amer: >60 mL/min (ref >60)
GFR calc non Af Amer: >60 mL/min (ref >60)
Glucose, Bld: 102 mg/dL — ABNORMAL HIGH (ref 70–99)
Potassium: 3.4 mmol/L — ABNORMAL LOW (ref 3.5–5.1)
Sodium: 138 mmol/L (ref 135–145)
Total Bilirubin: 0.5 mg/dL (ref 0.3–1.2)
Total Protein: 8.5 g/dL — ABNORMAL HIGH (ref 6.5–8.1)

## 2018-08-15 LAB — I-STAT BETA HCG BLOOD, ED (MC, WL, AP ONLY): I-stat hCG, quantitative: <5 m[IU]/mL (ref <5)

## 2018-08-15 LAB — LIPASE, BLOOD: Lipase: 20 U/L (ref 11–51)

## 2018-08-15 MED ORDER — SODIUM CHLORIDE 0.9% FLUSH: 3.0000 mL | Freq: Once | INTRAVENOUS | Status: DC

## 2018-08-15 MED ORDER — ALUM & MAG HYDROXIDE-SIMETH 200-200-20 MG/5ML PO SUSP
30.0000 mL | Freq: Once | ORAL | Status: AC
  Administered 2018-08-15: 30 mL via ORAL
  Filled 2018-08-15: qty 30

## 2018-08-15 NOTE — ED Provider Notes (Signed)
[facility] Provider Note  CSN: [id number] Arrival date & time: 08/15/18 0350  Chief Complaint(s) Abdominal Pain  HPI Gail Weber is a 33 y.o. female    Abdominal Pain Pain location:  Generalized Pain quality: not cramping   Pain severity:  Moderate Onset quality:  Gradual Duration:  4 days Timing:  Intermittent Progression:  Waxing and waning Chronicity:  New Context: eating   Context: not medication withdrawal, not recent illness, not recent sexual activity, not sick contacts and not trauma   Relieved by:  Eating Exacerbated by: ginger ale, motrin. Associated symptoms: constipation and nausea   Associated symptoms: no cough, no diarrhea, no dysuria, no fever, no flatus, no hematemesis, no hematochezia, no hematuria and no vomiting     Past Medical History Past Medical History:  Diagnosis Date  . Asthma   . Ectopic pregnancy    Patient Active Problem List   Diagnosis Date Noted  . Ectopic pregnancy 08/11/2015  . Ruptured ectopic pregnancy   . ANEMIA-NOS 09/11/2006  . ASTHMA 09/11/2006   Home Medication(s) Prior to Admission medications   Medication Sig Start Date End Date Taking? Authorizing Provider  acetaminophen (TYLENOL) 500 MG tablet Take 1,000 mg by mouth every 6 (six) hours as needed for mild pain.    [provider]  albuterol (PROVENTIL HFA;VENTOLIN HFA) 108 (90 BASE) MCG/ACT inhaler Inhale 2 puffs into the lungs every 6 (six) hours as needed. For shortness of breath.    [provider]                                                                                                                                    Past Surgical History Past Surgical History:  Procedure Laterality Date  . ECTOPIC PREGNANCY SURGERY    . LAPAROSCOPY N/A 08/11/2015   Procedure: LAPAROSCOPY DIAGNOSTIC;  Surgeon: Adam PhenixJames G Arnold, MD;  Location: [facility];  Service: Gynecology;  Laterality: N/A;  . LAPAROTOMY  08/11/2015   Procedure: LAPAROTOMY WITH EXCISION INTERSTITIAL ECTOPIC RIGHT;  Surgeon: Adam PhenixJames G Arnold, MD;  Location: [facility];  Service: Gynecology;;  . NO PAST SURGERIES     Family History Family History  Problem Relation Age of Onset  . Heart murmur Mother   . Mental illness Mother   . Heart murmur Brother     Social History Social History   Tobacco Use  . Smoking status: Current Every Day Smoker    Packs/day: 0.25    Years: 5.00    Pack years: 1.25    Types: Cigarettes  . Smokeless tobacco: Current User  Substance Use Topics  . Alcohol use: No  . Drug use: No   Allergies Latex  Review of Systems Review of Systems  Constitutional: Negative for fever.  Respiratory: Negative for cough.   Gastrointestinal: Positive for abdominal pain, constipation and nausea. Negative for diarrhea, flatus, hematemesis, hematochezia and vomiting.  Genitourinary: Negative for dysuria  and hematuria.   All other systems are reviewed and are negative for acute change except as noted in the HPI  Physical Exam Vital Signs  I have reviewed the triage vital signs BP 113/74   Pulse 72   Temp 98.3 F (36.8 C) (Oral)   Resp 18   LMP 07/22/2018   SpO2 100%   Physical Exam Vitals signs reviewed.  Constitutional:      General: She is not in acute distress.    Appearance: She is well-developed. She is not diaphoretic.  HENT:     Head: Normocephalic and atraumatic.     Right Ear: External ear normal.     Left Ear: External ear normal.     Nose: Nose normal.  Eyes:     General: No scleral icterus.    Conjunctiva/sclera: Conjunctivae normal.  Neck:     Musculoskeletal: Normal range of motion.     Trachea: Phonation normal.  Cardiovascular:     Rate and Rhythm: Normal rate and regular rhythm.  Pulmonary:     Effort: Pulmonary effort is normal. No respiratory distress.     Breath sounds: No stridor.  Abdominal:     General: There is no distension.     Tenderness: There is no abdominal tenderness.  There is no guarding or rebound.  Musculoskeletal: Normal range of motion.  Neurological:     Mental Status: She is alert and oriented to person, place, and time.  Psychiatric:        Behavior: Behavior normal.     ED Results and Treatments Labs (all labs ordered are listed, but only abnormal results are displayed) Labs Reviewed  COMPREHENSIVE METABOLIC PANEL - Abnormal; Notable for the following components:      Result Value   Potassium 3.4 (*)    Glucose, Bld 102 (*)    Total Protein 8.5 (*)    All other components within normal limits  CBC - Abnormal; Notable for the following components:   Hemoglobin 10.8 (*)    MCV 77.1 (*)    MCH 22.9 (*)    MCHC 29.8 (*)    All other components within normal limits  URINALYSIS, ROUTINE W REFLEX MICROSCOPIC - Abnormal; Notable for the following components:   APPearance HAZY (*)    Hgb urine dipstick SMALL (*)    Leukocytes,Ua SMALL (*)    All other components within normal limits  LIPASE, BLOOD  I-STAT BETA HCG BLOOD, ED ([facility], [facility], [facility] ONLY)                                                                                                                         EKG  EKG Interpretation  Date/Time:    Ventricular Rate:    PR Interval:    QRS Duration:   QT Interval:    QTC Calculation:   R Axis:     Text Interpretation:        Radiology No results found.  Pertinent labs & imaging  results that were available during my care of the patient were reviewed by me and considered in my medical decision making (see chart for details).  Medications Ordered in ED Medications  sodium chloride flush (NS) 0.9 % injection 3 mL (has no administration in time range)  alum & mag hydroxide-simeth (MAALOX/MYLANTA) 200-200-20 MG/5ML suspension 30 mL (has no administration in time range)                                                                                                                                    Procedures Procedures   (including critical care time)  Medical Decision Making / ED Course I have reviewed the nursing notes for this encounter and the patient's prior records (if available in EHR or on provided paperwork).   Gail Weber was evaluated in Emergency Department on 08/15/2018 for the symptoms described in the history of present illness. She was evaluated in the context of the global COVID-19 pandemic, which necessitated consideration that the patient might be at risk for infection with the SARS-CoV-2 virus that causes COVID-19. Institutional protocols and algorithms that pertain to the evaluation of patients at risk for COVID-19 are in a state of rapid change based on information released by regulatory bodies including the CDC and federal and state organizations. These policies and algorithms were followed during the patient's care in the ED.  Intermittent abd cramping. abd benign. Labs reassuring w/ negative beta hCG, no evidence of biliary obstruction or pancreatitis.  No significant electrolyte derangements or renal sufficiency.  No leukocytosis.  Low suspicion for serious intra-abdominal inflammatory/infectious process.  Likely functional abdominal pain/dyspepsia.  The patient appears reasonably screened and/or stabilized for discharge and I doubt any other medical condition or other [facility] requiring further screening, evaluation, or treatment in the ED at this time prior to discharge.  The patient is safe for discharge with strict return precautions.       Final Clinical Impression(s) / ED Diagnoses Final diagnoses:  Abdominal cramping    The patient appears reasonably screened and/or stabilized for discharge and I doubt any other medical condition or other [facility] requiring further screening, evaluation, or treatment in the ED at this time prior to discharge.  Disposition: Discharge  Condition: Good  I have discussed the results, Dx and Tx plan with the patient who expressed understanding  and agree(s) with the plan. Discharge instructions discussed at great length. The patient was given strict return precautions who verbalized understanding of the instructions. No further questions at time of discharge.    ED Discharge Orders    None        Follow Up: Kathreen CosierMarshall, Bernard A, MD [address] [place] [address] [phone]  Schedule an appointment as soon as possible for a visit  As needed      This chart was dictated using voice recognition software.  Despite best efforts to proofread,  errors can occur  which can change the documentation meaning.   Nira Connardama, Novice Vrba Eduardo, MD 08/15/18 [phone]

## 2018-08-15 NOTE — ED Notes (Signed)
Pt has been having generalized abd pain for the past 4 days with decreased appetite. Reports has been trying to drink gatorade and has taken motrin with improvement of pain

## 2018-11-12 ENCOUNTER — Encounter (HOSPITAL_COMMUNITY): Payer: Self-pay | Admitting: Emergency Medicine

## 2018-11-12 ENCOUNTER — Other Ambulatory Visit: Payer: Self-pay

## 2018-11-12 DIAGNOSIS — K0889 Other specified disorders of teeth and supporting structures: Secondary | ICD-10-CM | POA: Insufficient documentation

## 2018-11-12 DIAGNOSIS — Z91040 Latex allergy status: Secondary | ICD-10-CM | POA: Insufficient documentation

## 2018-11-12 DIAGNOSIS — J45909 Unspecified asthma, uncomplicated: Secondary | ICD-10-CM | POA: Insufficient documentation

## 2018-11-12 DIAGNOSIS — F17210 Nicotine dependence, cigarettes, uncomplicated: Secondary | ICD-10-CM | POA: Insufficient documentation

## 2018-11-12 NOTE — ED Triage Notes (Signed)
Patient here from home with complaints of right upper dental pain that started yesterday.

## 2018-11-13 ENCOUNTER — Emergency Department (HOSPITAL_COMMUNITY)
Admission: EM | Admit: 2018-11-13 | Discharge: 2018-11-13 | Disposition: A | Discharge status: Home / Self Care | Payer: Self-pay | Attending: Emergency Medicine | Admitting: Emergency Medicine

## 2018-11-13 DIAGNOSIS — K0889 Other specified disorders of teeth and supporting structures: Secondary | ICD-10-CM

## 2018-11-13 MED ORDER — PENICILLIN V POTASSIUM 500 MG PO TABS: 500.0000 mg | ORAL_TABLET | Freq: Four times a day (QID) | ORAL | 0 refills | Status: AC

## 2018-11-13 MED ORDER — PENICILLIN V POTASSIUM 500 MG PO TABS
500.0000 mg | ORAL_TABLET | Freq: Once | ORAL | Status: AC
  Administered 2018-11-13: 01:00:00 500 mg via ORAL
  Filled 2018-11-13: qty 1

## 2018-11-13 MED ORDER — BENZOCAINE 20 % MT GEL: 1.0000 "application " | Freq: Four times a day (QID) | OROMUCOSAL | 0 refills | Status: DC | PRN

## 2018-11-13 NOTE — Discharge Instructions (Addendum)
Please take entire course of antibiotics as directed.  Continue using ibuprofen 600 mg and Tylenol 1000mg  every 6 hours, as well as prescribed Orajel for pain.  You will need to follow-up with your dentist for continued management of this.  Return to the emergency department for fevers, swelling or pain under the tongue or in the neck, difficulty breathing or swallowing or any other new or concerning symptoms.

## 2018-11-13 NOTE — ED Provider Notes (Signed)
[place] DEPT Provider Note   CSN: [id number] Arrival date & time: 11/12/18  2338     History   Chief Complaint Chief Complaint  Patient presents with  . Dental Pain    HPI Gail Weber is a 33 y.o. female.     Gail Weber is a 33 y.o. female with a history of asthma, otherwise healthy, who presents to the ED for evaluation of right upper dental pain.  Symptoms were mild when he began yesterday but have become severe today she reports it is a constant throbbing pain and she is concerned she may have a dental abscess.  Reports remote history of dental infections for which she was treated with antibiotics.  She reports she used to have a dentist but has not seen one in about 2 years.  She has a tooth broken off at the gumline in the location of her pain.  Has not noted any swelling or drainage from anywhere.  No facial swelling or pain or swelling under the tongue.  No fevers or chills.  No nausea or vomiting.  Has taken ibuprofen and tried warm water rinses without improvement.     Past Medical History:  Diagnosis Date  . Asthma   . Ectopic pregnancy     Patient Active Problem List   Diagnosis Date Noted  . Ectopic pregnancy 08/11/2015  . Ruptured ectopic pregnancy   . ANEMIA-NOS 09/11/2006  . ASTHMA 09/11/2006    Past Surgical History:  Procedure Laterality Date  . ECTOPIC PREGNANCY SURGERY    . LAPAROSCOPY N/A 08/11/2015   Procedure: LAPAROSCOPY DIAGNOSTIC;  Surgeon: Woodroe Mode, MD;  Location: [place];  Service: Gynecology;  Laterality: N/A;  . LAPAROTOMY  08/11/2015   Procedure: LAPAROTOMY WITH EXCISION INTERSTITIAL ECTOPIC RIGHT;  Surgeon: Woodroe Mode, MD;  Location: [place] [facility];  Service: Gynecology;;  . NO PAST SURGERIES       OB History    Gravida  3   Para  1   Term  1   Preterm      AB  2   Living  1     SAB  1   TAB      Ectopic  1   Multiple      Live Births           Obstetric Comments  Right  INTERSTITIAL ECTOPIC removed surgically 08/11/15.  Right tube preserved according to op note. Pt informed that future right ectopic a possibility.         Home Medications    Prior to Admission medications   Medication Sig Start Date End Date Taking? Authorizing Provider  acetaminophen (TYLENOL) 500 MG tablet Take 1,000 mg by mouth every 6 (six) hours as needed for mild pain.    [provider]  albuterol (PROVENTIL HFA;VENTOLIN HFA) 108 (90 BASE) MCG/ACT inhaler Inhale 2 puffs into the lungs every 6 (six) hours as needed. For shortness of breath.    [provider]  benzocaine (HURRICAINE) 20 % GEL Use as directed 1 application in the mouth or throat 4 (four) times daily as needed. 11/13/18   Jacqlyn Larsen, PA-C  penicillin v potassium (VEETID) 500 MG tablet Take 1 tablet (500 mg total) by mouth 4 (four) times daily for 7 days. 11/13/18 11/20/18  Jacqlyn Larsen, PA-C    Family History Family History  Problem Relation Age of Onset  . Heart murmur Mother   . Mental illness Mother   .  Heart murmur Brother     Social History Social History   Tobacco Use  . Smoking status: Current Every Day Smoker    Packs/day: 0.25    Years: 5.00    Pack years: 1.25    Types: Cigarettes  . Smokeless tobacco: Current User  Substance Use Topics  . Alcohol use: No  . Drug use: No     Allergies   Latex   Review of Systems Review of Systems  Constitutional: Negative for chills and fever.  HENT: Positive for dental problem. Negative for facial swelling and trouble swallowing.   Musculoskeletal: Negative for neck pain and neck stiffness.  Skin: Negative for color change and rash.  Neurological: Negative for headaches.     Physical Exam Updated Vital Signs BP (!) 134/91 (BP Location: Left Arm)   Pulse 78   Temp 98.5 F (36.9 C) (Oral)   Resp 20   Ht 5\' 7"  (1.702 m)   Wt 108.9 kg   SpO2 98%   BMI 37.59 kg/m   Physical Exam Vitals signs and nursing note  reviewed.  Constitutional:      General: She is not in acute distress.    Appearance: Normal appearance. She is well-developed and normal weight. She is not ill-appearing or diaphoretic.  HENT:     Head: Normocephalic and atraumatic.     Mouth/Throat:     Dentition: Dental tenderness present.     Pharynx: Uvula midline. No pharyngeal swelling.      Comments: Posterior oropharynx is clear, tolerating secretions without difficulty, normal phonation, no pain or swelling sublingually Eyes:     General:        Right eye: No discharge.        Left eye: No discharge.  Pulmonary:     Effort: Pulmonary effort is normal. No respiratory distress.  Neurological:     Mental Status: She is alert.     Coordination: Coordination normal.  Psychiatric:        Behavior: Behavior normal.      ED Treatments / Results  Labs (all labs ordered are listed, but only abnormal results are displayed) Labs Reviewed - No data to display  EKG None  Radiology No results found.  Procedures Procedures (including critical care time)  Medications Ordered in ED Medications  penicillin v potassium (VEETID) tablet 500 mg (500 mg Oral Given 11/13/18 0054)     Initial Impression / Assessment and Plan / ED Course  I have reviewed the triage vital signs and the nursing notes.  Pertinent labs & imaging results that were available during my care of the patient were reviewed by me and considered in my medical decision making (see chart for details).  Patient with toothache.  No gross abscess.  Exam unconcerning for Ludwig's angina or spread of infection.  Will treat with penicillin and anti-inflammatories medicine.  Urged patient to follow-up with dentist.    Final Clinical Impressions(s) / ED Diagnoses   Final diagnoses:  Pain, dental    ED Discharge Orders         Ordered    penicillin v potassium (VEETID) 500 MG tablet  4 times daily     11/13/18 0040    benzocaine (HURRICAINE) 20 % GEL  4 times  daily PRN     11/13/18 0042           11/15/18, PA-C 11/13/18 11/15/18    1324, MD 11/13/18 [phone]

## 2019-08-30 ENCOUNTER — Emergency Department (HOSPITAL_COMMUNITY)
Admission: EM | Admit: 2019-08-30 | Discharge: 2019-08-30 | Disposition: A | Discharge status: Home / Self Care | Payer: Self-pay | Attending: Emergency Medicine | Admitting: Emergency Medicine

## 2019-08-30 ENCOUNTER — Emergency Department (HOSPITAL_COMMUNITY): Payer: Self-pay

## 2019-08-30 ENCOUNTER — Encounter (HOSPITAL_COMMUNITY): Payer: Self-pay

## 2019-08-30 ENCOUNTER — Other Ambulatory Visit: Payer: Self-pay

## 2019-08-30 DIAGNOSIS — M25561 Pain in right knee: Secondary | ICD-10-CM | POA: Insufficient documentation

## 2019-08-30 DIAGNOSIS — J45909 Unspecified asthma, uncomplicated: Secondary | ICD-10-CM | POA: Insufficient documentation

## 2019-08-30 DIAGNOSIS — Y999 Unspecified external cause status: Secondary | ICD-10-CM | POA: Insufficient documentation

## 2019-08-30 DIAGNOSIS — Y939 Activity, unspecified: Secondary | ICD-10-CM | POA: Insufficient documentation

## 2019-08-30 DIAGNOSIS — F419 Anxiety disorder, unspecified: Secondary | ICD-10-CM | POA: Insufficient documentation

## 2019-08-30 DIAGNOSIS — Y92410 Unspecified street and highway as the place of occurrence of the external cause: Secondary | ICD-10-CM | POA: Insufficient documentation

## 2019-08-30 DIAGNOSIS — M542 Cervicalgia: Secondary | ICD-10-CM | POA: Insufficient documentation

## 2019-08-30 DIAGNOSIS — F17210 Nicotine dependence, cigarettes, uncomplicated: Secondary | ICD-10-CM | POA: Insufficient documentation

## 2019-08-30 DIAGNOSIS — M545 Low back pain, unspecified: Secondary | ICD-10-CM

## 2019-08-30 DIAGNOSIS — Z91040 Latex allergy status: Secondary | ICD-10-CM | POA: Insufficient documentation

## 2019-08-30 LAB — POC URINE PREG, ED: Preg Test, Ur: NEGATIVE

## 2019-08-30 MED ORDER — ACETAMINOPHEN 500 MG PO TABS: 500.0000 mg | ORAL_TABLET | Freq: Four times a day (QID) | ORAL | 0 refills | Status: DC | PRN

## 2019-08-30 MED ORDER — CYCLOBENZAPRINE HCL 10 MG PO TABS
10.0000 mg | ORAL_TABLET | Freq: Once | ORAL | Status: AC
  Administered 2019-08-30: 10 mg via ORAL
  Filled 2019-08-30: qty 1

## 2019-08-30 MED ORDER — IBUPROFEN 600 MG PO TABS: 600.0000 mg | ORAL_TABLET | Freq: Four times a day (QID) | ORAL | 0 refills | Status: DC | PRN

## 2019-08-30 MED ORDER — HYDROXYZINE HCL 25 MG PO TABS: 25.0000 mg | ORAL_TABLET | Freq: Four times a day (QID) | ORAL | 0 refills | Status: DC | PRN

## 2019-08-30 MED ORDER — IBUPROFEN 400 MG PO TABS
600.0000 mg | ORAL_TABLET | Freq: Once | ORAL | Status: AC
  Administered 2019-08-30: 600 mg via ORAL
  Filled 2019-08-30: qty 1

## 2019-08-30 MED ORDER — CYCLOBENZAPRINE HCL 10 MG PO TABS: 10.0000 mg | ORAL_TABLET | Freq: Two times a day (BID) | ORAL | 0 refills | Status: DC | PRN
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
## 2019-08-30 NOTE — Discharge Instructions (Signed)
Your x-rays today were reassuring with no signs of fracture or serious injury.  1. Medications: Alternate 600 mg of ibuprofen and [phone] mg of Tylenol every 3 hours as needed for pain. Do not exceed 4000 mg of Tylenol daily.  Take ibuprofen with food to avoid upset stomach issues.  You can take Flexeril as needed for muscle spasm up to twice daily but do not drive, drink alcohol, or operate heavy machinery while taking this medicine because it may make you drowsy.  I typically recommend taking this medicine only at night when you are going to sleep.  He can also cut these tablets in half if they make you feel very drowsy. You can take hydroxyzine as needed for panic attacks. 2. Treatment: rest, drink plenty of fluids, gentle stretching as discussed (see attached), alternate ice and heat (or stick with whichever feels best) 20 minutes on 20 minutes off. 3. Follow Up: Please followup with your primary doctor in 3-7 days for discussion of your diagnoses and further evaluation after today's visit; if you do not have a primary care doctor use the resource guide provided to find one;  Return to the ER for worsening pain, severe headaches, persistent vomiting, difficulty walking, loss of bowel or bladder control or other concerning symptoms
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
## 2019-08-30 NOTE — ED Notes (Signed)
Patient verbalizes understanding of discharge instructions . Opportunity for questions and answers were provided . Armband removed by staff ,Pt discharged from ED. W/C  offered at D/C  and Declined W/C at D/C and was escorted to lobby by RN.  

## 2020-01-14 ENCOUNTER — Encounter (HOSPITAL_COMMUNITY): Payer: Self-pay | Admitting: Emergency Medicine

## 2020-01-14 ENCOUNTER — Emergency Department (HOSPITAL_COMMUNITY)
Admission: EM | Admit: 2020-01-14 | Discharge: 2020-01-15 | Disposition: A | Discharge status: Home / Self Care | Payer: Self-pay | Attending: Emergency Medicine | Admitting: Emergency Medicine

## 2020-01-14 ENCOUNTER — Other Ambulatory Visit: Payer: Self-pay

## 2020-01-14 ENCOUNTER — Emergency Department (HOSPITAL_COMMUNITY): Payer: Self-pay

## 2020-01-14 DIAGNOSIS — J45909 Unspecified asthma, uncomplicated: Secondary | ICD-10-CM | POA: Insufficient documentation

## 2020-01-14 DIAGNOSIS — R079 Chest pain, unspecified: Secondary | ICD-10-CM | POA: Insufficient documentation

## 2020-01-14 DIAGNOSIS — F17210 Nicotine dependence, cigarettes, uncomplicated: Secondary | ICD-10-CM | POA: Insufficient documentation

## 2020-01-14 DIAGNOSIS — R451 Restlessness and agitation: Secondary | ICD-10-CM | POA: Insufficient documentation

## 2020-01-14 DIAGNOSIS — Z91040 Latex allergy status: Secondary | ICD-10-CM | POA: Insufficient documentation

## 2020-01-14 DIAGNOSIS — F1910 Other psychoactive substance abuse, uncomplicated: Secondary | ICD-10-CM | POA: Insufficient documentation

## 2020-01-14 DIAGNOSIS — F22 Delusional disorders: Secondary | ICD-10-CM | POA: Insufficient documentation

## 2020-01-14 LAB — CBC
HCT: 39.6 % (ref 36.0–46.0)
Hemoglobin: 12.1 g/dL (ref 12.0–15.0)
MCH: 22.7 pg — ABNORMAL LOW (ref 26.0–34.0)
MCHC: 30.6 g/dL (ref 30.0–36.0)
MCV: 74.4 fL — ABNORMAL LOW (ref 80.0–100.0)
Platelets: 366 10*3/uL (ref 150–400)
RBC: 5.32 MIL/uL — ABNORMAL HIGH (ref 3.87–5.11)
RDW: 15.4 % (ref 11.5–15.5)
WBC: 10.7 10*3/uL — ABNORMAL HIGH (ref 4.0–10.5)
nRBC: 0 % (ref 0.0–0.2)

## 2020-01-14 LAB — BASIC METABOLIC PANEL
Anion gap: 12 (ref 5–15)
BUN: 17 mg/dL (ref 6–20)
CO2: 19 mmol/L — ABNORMAL LOW (ref 22–32)
Calcium: 9.4 mg/dL (ref 8.9–10.3)
Chloride: 103 mmol/L (ref 98–111)
Creatinine, Ser: 0.9 mg/dL (ref 0.44–1.00)
GFR, Estimated: >60 mL/min (ref >60)
Glucose, Bld: 94 mg/dL (ref 70–99)
Potassium: 4 mmol/L (ref 3.5–5.1)
Sodium: 134 mmol/L — ABNORMAL LOW (ref 135–145)

## 2020-01-14 LAB — I-STAT BETA HCG BLOOD, ED (MC, WL, AP ONLY): I-stat hCG, quantitative: <5 m[IU]/mL (ref <5)
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
## 2020-01-15 LAB — URINALYSIS, ROUTINE W REFLEX MICROSCOPIC
Bilirubin Urine: NEGATIVE
Glucose, UA: NEGATIVE mg/dL
Hgb urine dipstick: NEGATIVE
Ketones, ur: 80 mg/dL — AB
Nitrite: NEGATIVE
Protein, ur: 30 mg/dL — AB
Specific Gravity, Urine: 1.031 — ABNORMAL HIGH (ref 1.005–1.030)
pH: 5 (ref 5.0–8.0)

## 2020-01-15 LAB — ETHANOL: Alcohol, Ethyl (B): <10 mg/dL (ref <10)

## 2020-01-15 LAB — RAPID URINE DRUG SCREEN, HOSP PERFORMED
Amphetamines: POSITIVE — AB
Barbiturates: NOT DETECTED
Benzodiazepines: POSITIVE — AB
Cocaine: POSITIVE — AB
Opiates: NOT DETECTED
Tetrahydrocannabinol: POSITIVE — AB

## 2020-01-15 LAB — TROPONIN I (HIGH SENSITIVITY)
Troponin I (High Sensitivity): 5 ng/L (ref <18)
Troponin I (High Sensitivity): 6 ng/L (ref <18)

## 2020-01-15 MED ORDER — LORAZEPAM 2 MG/ML IJ SOLN: 1.0000 mg | Freq: Once | INTRAMUSCULAR | Status: DC

## 2020-01-15 MED ORDER — SODIUM CHLORIDE 0.9 % IV BOLUS
1000.0000 mL | Freq: Once | INTRAVENOUS | Status: AC
  Administered 2020-01-15: 1000 mL via INTRAVENOUS

## 2020-01-15 MED ORDER — LORAZEPAM 2 MG/ML IJ SOLN
1.0000 mg | Freq: Once | INTRAMUSCULAR | Status: AC
  Administered 2020-01-15: 1 mg via INTRAMUSCULAR
  Filled 2020-01-15: qty 1

## 2020-01-15 NOTE — ED Notes (Signed)
Pt able to void on bediside commode
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
## 2020-01-15 NOTE — ED Notes (Signed)
Pt seen smoking outside by entrance doors. Security aware and asking pt to step away from the doors.
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
## 2020-01-15 NOTE — ED Notes (Signed)
Pt made aware of UA needed

## 2020-01-15 NOTE — ED Notes (Signed)
Pt taken back to triage to collect blood, pt did not return from triage. Triage NT states that she had went down the back hallway into the hospital. Security notified.

## 2020-04-10 ENCOUNTER — Emergency Department (HOSPITAL_COMMUNITY)

## 2020-04-10 ENCOUNTER — Other Ambulatory Visit: Payer: Self-pay

## 2020-04-10 ENCOUNTER — Emergency Department (HOSPITAL_COMMUNITY)
Admission: EM | Admit: 2020-04-10 | Discharge: 2020-04-10 | Disposition: A | Discharge status: Home / Self Care | Attending: Emergency Medicine | Admitting: Emergency Medicine

## 2020-04-10 DIAGNOSIS — W19XXXA Unspecified fall, initial encounter: Secondary | ICD-10-CM

## 2020-04-10 DIAGNOSIS — S0181XA Laceration without foreign body of other part of head, initial encounter: Secondary | ICD-10-CM

## 2020-04-10 DIAGNOSIS — S0990XA Unspecified injury of head, initial encounter: Secondary | ICD-10-CM

## 2020-04-10 DIAGNOSIS — T1490XA Injury, unspecified, initial encounter: Secondary | ICD-10-CM

## 2020-04-10 DIAGNOSIS — Z91040 Latex allergy status: Secondary | ICD-10-CM | POA: Diagnosis not present

## 2020-04-10 DIAGNOSIS — Y92148 Other place in prison as the place of occurrence of the external cause: Secondary | ICD-10-CM | POA: Diagnosis not present

## 2020-04-10 DIAGNOSIS — W108XXA Fall (on) (from) other stairs and steps, initial encounter: Secondary | ICD-10-CM | POA: Diagnosis not present

## 2020-04-10 DIAGNOSIS — Z23 Encounter for immunization: Secondary | ICD-10-CM | POA: Insufficient documentation

## 2020-04-10 LAB — I-STAT BETA HCG BLOOD, ED (MC, WL, AP ONLY): I-stat hCG, quantitative: <5 m[IU]/mL (ref <5)

## 2020-04-10 LAB — I-STAT CHEM 8, ED
BUN: 19 mg/dL (ref 6–20)
Calcium, Ion: 1.02 mmol/L — ABNORMAL LOW (ref 1.15–1.40)
Chloride: 111 mmol/L (ref 98–111)
Creatinine, Ser: 0.8 mg/dL (ref 0.44–1.00)
Glucose, Bld: 85 mg/dL (ref 70–99)
HCT: 39 % (ref 36.0–46.0)
Hemoglobin: 13.3 g/dL (ref 12.0–15.0)
Potassium: 5.1 mmol/L (ref 3.5–5.1)
Sodium: 137 mmol/L (ref 135–145)
TCO2: 18 mmol/L — ABNORMAL LOW (ref 22–32)

## 2020-04-10 MED ORDER — BACITRACIN ZINC 500 UNIT/GM EX OINT: 1.0000 "application " | TOPICAL_OINTMENT | Freq: Two times a day (BID) | CUTANEOUS | Status: DC

## 2020-04-10 MED ORDER — OXYCODONE-ACETAMINOPHEN 5-325 MG PO TABS
2.0000 | ORAL_TABLET | Freq: Once | ORAL | Status: AC
  Administered 2020-04-10: 2 via ORAL
  Filled 2020-04-10: qty 2

## 2020-04-10 MED ORDER — TETANUS-DIPHTH-ACELL PERTUSSIS 5-2.5-18.5 LF-MCG/0.5 IM SUSY
0.5000 mL | PREFILLED_SYRINGE | Freq: Once | INTRAMUSCULAR | Status: AC
  Administered 2020-04-10: 0.5 mL via INTRAMUSCULAR
  Filled 2020-04-10: qty 0.5

## 2020-04-10 NOTE — ED Notes (Signed)
woound dressed with 2 x2 and taped  The wound has been  Oozing dr Hyacinth Meeker has  Reassessed  The wounds per request of the pt

## 2020-04-10 NOTE — Discharge Instructions (Addendum)
The [facility], there are 9 total stitches, if you are at home you can have an urgent care or your family doctor remove the stitches as well.  They should come out in around 5-7 days  Keep a topical antibiotic ointment such as Neosporin or bacitracin on the wound at all times, Tylenol or ibuprofen as needed for pain  Ice packs to help reduce swelling  Emergency department for increasing pain swelling redness pus or fever
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
## 2020-04-10 NOTE — ED Notes (Signed)
Ambulatory without difficulty

## 2020-04-10 NOTE — ED Provider Notes (Signed)
[facility] EMERGENCY DEPARTMENT Provider Note   CSN: [id number] Arrival date & time: 04/10/20  1556     History No chief complaint on file.   Gail Weber is a 35 y.o. female.  HPI   This patient is a 35 year old female, we do not have any prior medical history available, she presents with altered mental status after reportedly being witnessed falling from a second floor to the first floor at the jail where she is currently incarcerated.  I have spoken personally with the officer who was witness to the events, he states that he was trying to relocate her from 1 cell to another cell when in an attempt to evade this he believes she slid through the railing of the second floor falling to the first floor, he believes that she landed feet first but bent forward striking her head on the ground as well.  She was immediately altered confused and when paramedics arrived they found her to have a stellate wound which had some bleeding to the anterior forehead, she was not moving her left side very well, that being said they state that in route to the hospital she was seen to be moving all 4 extremities at one point.  The patient refuses to give me any information, she slurs her words intermittently with clear speech, asked some questions clearly but then when I asked a question she mumbles and evades my questions.  This occurred just prior to arrival, a dressing was placed prehospital as well as a cervical collar.  No past medical history on file.  There are no problems to display for this patient.   Denies PMH   OB History   No obstetric history on file.     No family history on file.     Home Medications Prior to Admission medications   Not on File    Allergies    Latex  Review of Systems   Review of Systems  All other systems reviewed and are negative.   Physical Exam Updated Vital Signs BP 119/73    Pulse 66    Temp 98 F (36.7 C)    Resp 13    Ht 1.702  m ( )    Wt 104.3 kg    LMP  (LMP Unknown)    SpO2 99%    BMI 36.02 kg/m   Physical Exam Vitals and nursing note reviewed.  Constitutional:      General: She is not in acute distress.    Appearance: She is well-developed.  HENT:     Head: Normocephalic.     Comments: Complex stellate wound to the mid forehead, no obvious palpable deformity or depression of the forehead, no malocclusion or hemotympanum    Nose: No congestion or rhinorrhea.     Mouth/Throat:     Pharynx: No oropharyngeal exudate.  Eyes:     General: No scleral icterus.       Right eye: No discharge.        Left eye: No discharge.     Conjunctiva/sclera: Conjunctivae normal.     Pupils: Pupils are equal, round, and reactive to light.  Neck:     Thyroid: No thyromegaly.     Vascular: No JVD.  Cardiovascular:     Rate and Rhythm: Normal rate and regular rhythm.     Heart sounds: Normal heart sounds. No murmur heard. No friction rub. No gallop.   Pulmonary:     Effort: Pulmonary effort is normal. No  respiratory distress.     Breath sounds: Normal breath sounds. No wheezing or rales.     Comments: There is no tenderness over the clavicles or the chest wall, chaperone present for the exam, entire chest wall and abdominal wall was exposed, there is no signs of bruising or tenderness over the rib cage no subcutaneous emphysema. Chest:     Chest wall: No tenderness.  Abdominal:     General: Bowel sounds are normal. There is no distension.     Palpations: Abdomen is soft. There is no mass.     Tenderness: There is no abdominal tenderness.  Musculoskeletal:        General: No tenderness. Normal range of motion.     Cervical back: Normal range of motion and neck supple.     Comments: There is no obvious deformities of any of the 4 extremities  Lymphadenopathy:     Cervical: No cervical adenopathy.  Skin:    General: Skin is warm and dry.     Findings: No erythema or rash.     Comments: Laceration noted to the mid  forehead  Neurological:     Mental Status: She is alert.     Coordination: Coordination normal.     Comments: The patient is able to move her right arm and right leg, she will not move her left leg, to deep painful stimuli she does not react to the leg or the arm on the left, to the right side she does.  She has no obvious facial droop, she can speak clearly intermittently.  Psychiatric:        Behavior: Behavior normal.     ED Results / Procedures / Treatments   Labs (all labs ordered are listed, but only abnormal results are displayed) Labs Reviewed  I-STAT CHEM 8, ED - Abnormal; Notable for the following components:      Result Value   Calcium, Ion 1.02 (*)    TCO2 18 (*)    All other components within normal limits  RESP PANEL BY RT-PCR (FLU A&B, COVID) [id number]  I-STAT BETA HCG BLOOD, ED ([facility], [facility], [facility] ONLY)    EKG None  Radiology CT HEAD WO CONTRAST  Result Date: 04/10/2020 CLINICAL DATA:  Head trauma, moderate/severe. Polytrauma, critical, head/cervical spine injury suspected. Additional history provided: Fall from second story balcony today. EXAM: CT HEAD WITHOUT CONTRAST CT CERVICAL SPINE WITHOUT CONTRAST TECHNIQUE: Multidetector CT imaging of the head and cervical spine was performed following the standard protocol without intravenous contrast. Multiplanar CT image reconstructions of the cervical spine were also generated. COMPARISON:  No pertinent prior exams available for comparison. FINDINGS: CT HEAD FINDINGS Brain: Cerebral volume is normal. There is no acute intracranial hemorrhage. No demarcated cortical infarct. No extra-axial fluid collection. No evidence of intracranial mass. No midline shift. Vascular: No hyperdense vessel. Skull: Normal. Negative for fracture or focal lesion. Sinuses/Orbits: Visualized orbits show no acute finding. Small volume frothy secretions and small fluid level within the visualized right maxillary sinus. Other: Forehead hematoma and laceration. CT  CERVICAL SPINE FINDINGS Alignment: Reversal of the expected cervical lordosis. No significant spondylolisthesis. Skull base and vertebrae: The basion-dental and atlanto-dental intervals are maintained.No evidence of acute fracture to the cervical spine. Soft tissues and spinal canal: No prevertebral fluid or swelling. No visible canal hematoma. Disc levels: The intervertebral disc spaces are maintained. No significant bony spinal canal or neural foraminal narrowing. Upper chest: No consolidation within the imaged lung apices. No visible pneumothorax IMPRESSION: CT head:  1. No evidence of acute intracranial abnormality. 2. Forehead hematoma and laceration. 3. Small volume frothy secretions and small fluid level within the imaged right maxillary sinus. CT cervical spine: 1. No evidence of acute fracture to the cervical spine. 2. Nonspecific reversal of the expected cervical lordosis. Electronically Signed   By: Jackey LogeKyle  Golden DO   On: 04/10/2020 17:12   CT CERVICAL SPINE WO CONTRAST  Result Date: 04/10/2020 CLINICAL DATA:  Head trauma, moderate/severe. Polytrauma, critical, head/cervical spine injury suspected. Additional history provided: Fall from second story balcony today. EXAM: CT HEAD WITHOUT CONTRAST CT CERVICAL SPINE WITHOUT CONTRAST TECHNIQUE: Multidetector CT imaging of the head and cervical spine was performed following the standard protocol without intravenous contrast. Multiplanar CT image reconstructions of the cervical spine were also generated. COMPARISON:  No pertinent prior exams available for comparison. FINDINGS: CT HEAD FINDINGS Brain: Cerebral volume is normal. There is no acute intracranial hemorrhage. No demarcated cortical infarct. No extra-axial fluid collection. No evidence of intracranial mass. No midline shift. Vascular: No hyperdense vessel. Skull: Normal. Negative for fracture or focal lesion. Sinuses/Orbits: Visualized orbits show no acute finding. Small volume frothy secretions and  small fluid level within the visualized right maxillary sinus. Other: Forehead hematoma and laceration. CT CERVICAL SPINE FINDINGS Alignment: Reversal of the expected cervical lordosis. No significant spondylolisthesis. Skull base and vertebrae: The basion-dental and atlanto-dental intervals are maintained.No evidence of acute fracture to the cervical spine. Soft tissues and spinal canal: No prevertebral fluid or swelling. No visible canal hematoma. Disc levels: The intervertebral disc spaces are maintained. No significant bony spinal canal or neural foraminal narrowing. Upper chest: No consolidation within the imaged lung apices. No visible pneumothorax IMPRESSION: CT head: 1. No evidence of acute intracranial abnormality. 2. Forehead hematoma and laceration. 3. Small volume frothy secretions and small fluid level within the imaged right maxillary sinus. CT cervical spine: 1. No evidence of acute fracture to the cervical spine. 2. Nonspecific reversal of the expected cervical lordosis. Electronically Signed   By: Jackey LogeKyle  Golden DO   On: 04/10/2020 17:12   DG Pelvis Portable  Result Date: 04/10/2020 CLINICAL DATA:  Status post fall. EXAM: PORTABLE PELVIS 1-2 VIEWS COMPARISON:  None. FINDINGS: There is no evidence of pelvic fracture or diastasis. No pelvic bone lesions are seen. IMPRESSION: Negative. Electronically Signed   By: Sherian ReinWei-Chen  Lin M.D.   On: 04/10/2020 16:13   DG Chest Port 1 View  Result Date: 04/10/2020 CLINICAL DATA:  Status post fall.  Level 2 trauma. EXAM: PORTABLE CHEST 1 VIEW COMPARISON:  January 14, 2020 FINDINGS: Mediastinal contour is normal. Heart size is enlarged. Both lungs are clear. The visualized skeletal structures are unremarkable. IMPRESSION: Enlarged cardiac silhouette.  Exam is otherwise normal. Electronically Signed   By: Sherian ReinWei-Chen  Lin M.D.   On: 04/10/2020 16:13   CT CHEST ABDOMEN PELVIS WO CONTRAST  Result Date: 04/10/2020 CLINICAL DATA:  Fall from a 2 story balcony with  back pain. EXAM: CT CHEST, ABDOMEN AND PELVIS WITHOUT CONTRAST TECHNIQUE: Multidetector CT imaging of the chest, abdomen and pelvis was performed following the standard protocol without IV contrast. COMPARISON:  CT abdomen pelvis dated 05/09/2005 and same day chest radiograph. FINDINGS: CT CHEST FINDINGS Cardiovascular: No significant vascular findings. The heart is mildly enlarged. No pericardial effusion. Mediastinum/Nodes: No enlarged mediastinal, hilar, or axillary lymph nodes. Thyroid gland, trachea, and esophagus demonstrate no significant findings. Lungs/Pleura: Lungs are clear. No pleural effusion or pneumothorax. Musculoskeletal: No chest wall mass or suspicious bone lesions identified.  CT ABDOMEN PELVIS FINDINGS Hepatobiliary: No focal liver abnormality is seen. No gallstones, gallbladder wall thickening, or biliary dilatation. Pancreas: Unremarkable. No pancreatic ductal dilatation or surrounding inflammatory changes. Spleen: Normal in size without focal abnormality. Adrenals/Urinary Tract: Adrenal glands are unremarkable. Kidneys are normal, without renal calculi, focal lesion, or hydronephrosis. Bladder is unremarkable. Stomach/Bowel: Stomach is within normal limits. Appendix appears normal. No evidence of bowel wall thickening, distention, or inflammatory changes. Vascular/Lymphatic: No significant vascular findings are present. No enlarged abdominal or pelvic lymph nodes. Reproductive: Uterus and bilateral adnexa are unremarkable. Other: No abdominal wall hernia or abnormality. No abdominopelvic ascites. Musculoskeletal: No acute or significant osseous findings. IMPRESSION: 1. No acute traumatic injury to the chest, abdomen or pelvis. Electronically Signed   By: Romona Curls M.D.   On: 04/10/2020 17:22    Procedures .Marland KitchenLaceration Repair  Date/Time: 04/10/2020 8:01 PM Performed by: Eber Hong, MD Authorized by: Eber Hong, MD   Consent:    Consent obtained:  Verbal   Consent given by:   Patient   Risks discussed:  Infection, pain, need for additional repair, poor cosmetic result and poor wound healing   Alternatives discussed:  No treatment and delayed treatment Universal protocol:    Procedure explained and questions answered to patient or proxy's satisfaction: yes     Relevant documents present and verified: yes     Test results available: yes     Imaging studies available: yes     Required blood products, implants, devices, and special equipment available: yes     Site/side marked: yes     Immediately prior to procedure, a time out was called: yes     Patient identity confirmed:  Verbally with patient Anesthesia:    Anesthesia method:  Local infiltration   Local anesthetic:  Lidocaine 1% WITH epi Laceration details:    Location:  Face   Face location:  Forehead   Length (cm):  6   Depth (mm):  3 Pre-procedure details:    Preparation:  Patient was prepped and draped in usual sterile fashion and imaging obtained to evaluate for foreign bodies Exploration:    Hemostasis achieved with:  Direct pressure   Imaging obtained comment:  CT head   Wound exploration: wound explored through full range of motion     Wound extent: no fascia violation noted, no foreign bodies/material noted, no muscle damage noted, no nerve damage noted, no tendon damage noted, no underlying fracture noted and no vascular damage noted     Contaminated: no   Treatment:    Area cleansed with:  Betadine   Amount of cleaning:  Standard   Irrigation solution:  Sterile saline   Irrigation method:  Syringe   Debridement:  None   Undermining:  None Skin repair:    Repair method:  Sutures   Suture size:  6-0   Suture material:  Prolene   Suture technique:  Simple interrupted   Number of sutures:  9 Approximation:    Approximation:  Close Repair type:    Repair type:  Complex Post-procedure details:    Dressing:  Antibiotic ointment and sterile dressing   Procedure completion:  Tolerated  well, no immediate complications Comments:     This was a complex stellate wound of the forehead requiring multiple flaps coming together.  There was no violation of the galea, bleeding was controlled with pressure, hemostasis was obtained with blood pressure and lidocaine with epinephrine.  The patient tolerated the procedure very well.  Medications Ordered in ED Medications  oxyCODONE-acetaminophen (PERCOCET/ROXICET) 5-325 MG per tablet 2 tablet (2 tablets Oral Given 04/10/20 1809)  Tdap (BOOSTRIX) injection 0.5 mL (0.5 mLs Intramuscular Given 04/10/20 1915)    ED Course  I have reviewed the triage vital signs and the nursing notes.  Pertinent labs & imaging results that were available during my care of the patient were reviewed by me and considered in my medical decision making (see chart for details).  Clinical Course as of 04/11/20 2327  Sat Apr 10, 2020  1910 Reexamined - pt is now moving all 4 extremities -she has normal level of alertness, reexamined her neck and there is no signs of tenderness, she has no neurologic dysfunction and is awake and alert.  Thankfully the CT scans do not reveal any signs of significant injury [BM]  1910 No obvious spinal injuries or internal organ injuries or brain injury other than a likely concussion [BM]  1911 We will proceed with p.o. trial and laceration repair [BM]    Clinical Course User Index [BM] Eber Hong, MD   MDM Rules/Calculators/A&P                          Given the injuries there is certainly concern for spinal cord injury and potential brain injury.  She will go immediately to CT scan.  The prehospital cervical collar was switched out for an Aspen collar for better stability.  Portable chest and pelvis performed  CT pending  CT scans negative for any significant injuries, the patient has been able to eat, she is now awake alert and moving all 4 extremities, she will ambulate prior to being discharged back to  jail.  Ambulated very well without any difficulty prior to d/c  Final Clinical Impression(s) / ED Diagnoses Final diagnoses:  Fall  Trauma  Forehead laceration, initial encounter  Injury of head, initial encounter      Eber Hong, MD 04/11/20 2327

## 2020-04-10 NOTE — ED Notes (Signed)
Pt sitting up in bed, moving all extremities appropriately and equally. Given [place] sandwich and something to drink per Dr. Hyacinth Meeker.

## 2020-04-10 NOTE — Progress Notes (Signed)
Situation: Chaplain responding to trauma level 2 for pt Antoinette Brentlinger.  Background: Facts: Per nurse, Ms. Suarez fell from a 2nd story height. Ms. Helming was in jail at the time of injury. This chaplain did not inquire details about Ms. Lantry's fall at this time. Family: Ms. Rathel shared that she is originally from [place], but has lived in [place] since 2005 and that [place] is where her family lives. Ms. Montanye shared that she has a son who will turn 36 on April 3rd. She shared that he likes music and raps. Ms. Fudala also shared that she has not had much contact with her family lately. Nurses shared that due to her status of being in jail, Ms. Suppa was not permitted to call family at this time. Feelings: Ms. Sefcik was tearful during today's visit and complained of much pain. Chaplain notified RN of this update. Faith: Ms. Mcloughlin expressed interest in prayer at the time of visit.  Actions & Assessments: Ms. Scannell seems to be in some emotional pain as well as physical pain which may be related to her situation.  Chaplain offered prayer for relief of pain and petitioning for hope.  Recommendations: For Chaplains: As requested, offer space for reflection and discussion around isolation. For Staff: Chaplain remains available for follow-up spiritual/emotional support as needed.  Rev. Mayme Genta, MDiv      04/10/20 1800  Clinical Encounter Type  Visited With Patient  Visit Type Initial;Trauma  Spiritual Encounters  Spiritual Needs Prayer;Emotional

## 2020-04-10 NOTE — Progress Notes (Signed)
Orthopedic Tech Progress Note Patient Details:  Gail Weber 04-25-1985 [id number] Level 2 Trauma Patient ID: Gail Weber, female   DOB: 09-01-1985, 35 y.o.   MRN: [id number]   Gail Weber 04/10/2020, 4:08 PM

## 2021-08-20 ENCOUNTER — Encounter (HOSPITAL_COMMUNITY): Payer: Self-pay

## 2021-08-20 ENCOUNTER — Emergency Department (HOSPITAL_COMMUNITY)
Admission: EM | Admit: 2021-08-20 | Discharge: 2021-08-20 | Disposition: A | Discharge status: Home / Self Care | Payer: Self-pay | Attending: Emergency Medicine | Admitting: Emergency Medicine

## 2021-08-20 ENCOUNTER — Other Ambulatory Visit: Payer: Self-pay

## 2021-08-20 ENCOUNTER — Ambulatory Visit (HOSPITAL_COMMUNITY)
Admission: EM | Admit: 2021-08-20 | Discharge: 2021-08-20 | Disposition: A | Discharge status: Home / Self Care | Payer: No Payment, Other | Attending: Psychiatry | Admitting: Psychiatry

## 2021-08-20 DIAGNOSIS — F1190 Opioid use, unspecified, uncomplicated: Secondary | ICD-10-CM

## 2021-08-20 DIAGNOSIS — F1410 Cocaine abuse, uncomplicated: Secondary | ICD-10-CM | POA: Insufficient documentation

## 2021-08-20 DIAGNOSIS — Z91040 Latex allergy status: Secondary | ICD-10-CM | POA: Insufficient documentation

## 2021-08-20 DIAGNOSIS — F17210 Nicotine dependence, cigarettes, uncomplicated: Secondary | ICD-10-CM | POA: Insufficient documentation

## 2021-08-20 DIAGNOSIS — F1910 Other psychoactive substance abuse, uncomplicated: Secondary | ICD-10-CM | POA: Insufficient documentation

## 2021-08-20 DIAGNOSIS — Z9151 Personal history of suicidal behavior: Secondary | ICD-10-CM | POA: Insufficient documentation

## 2021-08-20 NOTE — ED Triage Notes (Addendum)
Pt presents to [facility] accompanied by her brother seeking substance use treatment and detox. Pt states she was discharged from the hospital today and was instructed to follow-up with [facility] for further care. 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 Pt states she last used heroin and cocaine, unknown amount, yesterday. Pt states she tried to get drugs prior to coming to this facility but she was with her brother. Pt states " I don't think I need help but my brother and my son think I need help so I am here". Pt states she has a lack of energy. Pt denies SI/HI, NSSIB, and AVH.

## 2021-08-20 NOTE — Progress Notes (Signed)
CSW provided the following resources for the patient to utilize:  Substance Abuse Resources   [facility] - Admissions are currently completed Monday through Friday at 8am; both appointments and walk-ins are accepted.  Any individual that is a [facility] resident may present for a substance abuse screening and assessment for admission.  A person may be referred by numerous sources or self-refer.   Potential clients will be screened for medical necessity and appropriateness for the program.  Clients must meet criteria for high-intensity residential treatment services.  If clinically appropriate, a client will continue with the comprehensive clinical assessment and intake process, as well as enrollment in the [facility].  Address: [address] [place], [place] [address] Admin Hours: Mon-Fri 8AM to [facility] Hours: 24/7 Phone: [phone] Fax: [phone]  [facility] (Detox) Facility Based Crisis:  These are 3 locations for services for week and weekend: Please call before arrival:    [facility] Recovery Facility Based Crisis [facility])  Address: [address] [place], [place] [address] Phone: [phone]  [facility] Recovery Facility Based Crisis [facility]) Address: [address] [place], [place] [address] Phone#: [phone]  [facility] Recovery Facility Based Crisis [facility]) Address: [address] [place], [place] [address] Phone#: [phone]   [facility]): (offers outpatient therapy and intensive outpatient substance abuse therapy).  [address] [place], [place] [address] Phone: [phone]   Phone: [phone]  [facility] Intensive Outpatient [facility]) means structured individual and group addiction activities and services that are provided at an outpatient program designed to assist adult and adolescent consumers to begin recovery and learn skills for recovery  maintenance. The [facility] SAIOP program is offered at least 3 hours a day, 3 days a week. SAIOP services shall include a structured program consisting of, but not limited to, the following services: Individual counseling and support; Group counseling and support; Family counseling, training or support; Biochemical assays to identify recent drug use (e.g., urine drug screens); Strategies for relapse prevention to include community and social support systems in treatment; Life skills; Crisis contingency planning; Disease Management; and Treatment support activities that have been adapted or specifically designed for persons with physical disabilities, or persons with co-occurring disorders of mental illness and substance abuse/dependence or mental retardation/developmental disability and substance abuse/dependence.  Phone: [phone]   [facility])  Address: [address] [place], [place], [place] [address] Phone: [phone]   [facility] Address: [address] [place], [place] [address] Phone: [phone]  - a combination of group and individual sessions to meet the participants needs. This allows participants to engage in treatment and remain involved in their home and work life. - Transitional housing places program participants in a supportive living environment while they complete a treatment program and work to secure independent housing. - The Substance Abuse Intensive Outpatient Treatment Program at [employer] consists of structured group sessions and individual sessions that are designed to teach participants early recovery and relapse prevention skills. -[facility] works with the [employer] to provide a housing and treatment program for homeless veterans.   [facility] [employer] [facility] [employer].   Address: [address] [place], [place] [address] Phone#: [phone]   : Referrals to RTSA facilities can be made  by [employer] and [facility]  Referrals are also accepted from physicians, private providers, hospital emergency rooms, family members, or any person who  has knowledge of someone in the need of our services.  The [facility] will also offer the following outpatient services: (Monday through Friday 8am-5pm)   Partial Hospitalization Program (PHP) Substance Abuse Intensive Outpatient Program (SA-IOP) Group Therapy Medication Management Peer Living Room We also provide (24/7):  Assessments: Our mental health clinician and providers will conduct a focused mental health evaluation, assessing for immediate safety concerns and further mental health needs. Referral: Our team will provide resources and help connect to community based mental health treatment, when indicated, including psychotherapy, psychiatry, and other specialized behavioral health or substance use disorder services (for those not already in treatment). Transitional Care: Our team providers in person bridging and/or telephonic follow-up during the patient's transition to outpatient services.   [facility] 24-Hour Call Center: [phone] Behavioral Health Crisis Line: [phone]   Crissie Reese, MSW, Lenice Pressman Phone: [phone] Disposition/TOC

## 2021-08-20 NOTE — Discharge Instructions (Addendum)
You are provided contact information for local resources in regards to substance abuse and detox and treatment services.  Please return to the hospital if you have worsening symptoms or other concerns.

## 2021-08-20 NOTE — Discharge Instructions (Addendum)
Patient is instructed prior to discharge to:  Take all medications as prescribed by his/her mental healthcare provider. Report any adverse effects and or reactions from the medicines to his/her outpatient provider promptly. Keep all scheduled appointments, to ensure that you are getting refills on time and to avoid any interruption in your medication.  If you are unable to keep an appointment call to reschedule.  Be sure to follow-up with resources and follow-up appointments provided.  Patient has been instructed & cautioned: To not engage in alcohol and or illegal drug use while on prescription medicines. In the event of worsening symptoms, patient is instructed to call the crisis hotline, 911 and or go to the nearest ED for appropriate evaluation and treatment of symptoms. To follow-up with his/her primary care provider for your other medical issues, concerns and or health care needs.      Substance Abuse Resources   [facility] - Admissions are currently completed Monday through Friday at 8am; both appointments and walk-ins are accepted.  Any individual that is a [facility] resident may present for a substance abuse screening and assessment for admission.  A person may be referred by numerous sources or self-refer.   Potential clients will be screened for medical necessity and appropriateness for the program.  Clients must meet criteria for high-intensity residential treatment services.  If clinically appropriate, a client will continue with the comprehensive clinical assessment and intake process, as well as enrollment in the [facility].  Address: [address] [place], [place] [address] Admin Hours: Mon-Fri 8AM to [facility] Hours: 24/7 Phone: [phone] Fax: [phone]  [facility] (Detox) Facility Based Crisis:  These are 3 locations for services for week and weekend: Please call before arrival:    [facility]  Based Crisis [facility])  Address: [address] [place], [place] [address] Phone: [phone]  [facility] Based Crisis [facility]) Address: [address] [place], [place] [address] Phone#: [phone]  [facility] Facility Based Crisis [facility]) Address: [address] [place], [place] [address] Phone#: [phone]   [facility]): (offers outpatient therapy and intensive outpatient substance abuse therapy).  [address] [place], [place] [address] Phone: [phone]   Phone: [phone]  [facility] Intensive Outpatient Program [facility]) means structured individual and group addiction activities and services that are provided at an outpatient program designed to assist adult and adolescent consumers to begin recovery and learn skills for recovery maintenance. The [employer] SAIOP program is offered at least 3 hours a day, 3 days a week. SAIOP services shall include a structured program consisting of, but not limited to, the following services: Individual counseling and support; Group counseling and support; Family counseling, training or support; Biochemical assays to identify recent drug use (e.g., urine drug screens); Strategies for relapse prevention to include community and social support systems in treatment; Life skills; Crisis contingency planning; Disease Management; and Treatment support activities that have been adapted or specifically designed for persons with physical disabilities, or persons with co-occurring disorders of mental illness and substance abuse/dependence or mental retardation/developmental disability and substance abuse/dependence.  Phone: [phone]   [facility])  Address: [address] [place], [place], [place] [address] Phone: [phone]   [facility] Address: [address] [place], [place] [address] Phone: [phone]  - a combination of group  and individual sessions to meet the participants needs. This allows  participants to engage in treatment and remain involved in their home and work life. - Transitional housing places program participants in a supportive living environment while they complete a treatment program and work to secure independent housing. - The Substance Abuse Intensive Outpatient Treatment Program at [employer] consists of structured group sessions and individual sessions that are designed to teach participants early recovery and relapse prevention skills. -[facility] works with the [employer] to provide a housing and treatment program for homeless veterans.   [facility] [employer] [facility] [employer].   Address: [address] [place], [place] [address] Phone#: [phone]   : Referrals to RTSA facilities can be made by [employer] and [facility].  Referrals are also accepted from physicians, private providers, hospital emergency rooms, family members, or any person who has knowledge of someone in the need of our services.  [facility] will also offer the following outpatient services: (Monday through Friday 8am-5pm)   Partial Hospitalization Program (PHP) Substance Abuse Intensive Outpatient Program (SA-IOP) Group Therapy Medication Management Peer Living Room We also provide (24/7):  Assessments: Our mental health clinician and providers will conduct a focused mental health evaluation, assessing for immediate safety concerns and further mental health needs. Referral: Our team will provide resources and help connect to community based mental health treatment, when indicated, including psychotherapy, psychiatry, and other specialized behavioral health or substance use disorder services (for those not already in treatment). Transitional Care: Our team providers in person bridging and/or telephonic follow-up during the patient's transition to outpatient services.    [facility] 24-Hour Call Center: [phone] Behavioral Health Crisis Line: [phone]

## 2021-08-20 NOTE — ED Provider Notes (Signed)
[facility]-EMERGENCY DEPT Provider Note   CSN: [id number] Arrival date & time: 08/20/21  1121     History  Chief Complaint  Patient presents with   Drug Problem    Gail Weber is a 36 y.o. female.  Patient presents to the emergency department today requesting detox services for opiates and cocaine.  Patient states that her last use was last evening.  She states that she smokes and does not inject.  She complains about being very tired and has been having difficulty with hearing.  No recent fevers or other symptoms of illness.  No vomiting or diarrhea.  No lightheadedness, shortness of breath or chest pains.  She denies suicidal or homicidal ideations.  She states that she has not slept well in the past 3 days.       Home Medications Prior to Admission medications   Not on File      Allergies    Latex and Latex    Review of Systems   Review of Systems  Physical Exam Updated Vital Signs BP 139/90 (BP Location: Right Arm)   Pulse 85   Temp 98.8 F (37.1 C) (Oral)   Resp 16   SpO2 95%   Physical Exam Vitals and nursing note reviewed.  Constitutional:      General: She is not in acute distress.    Appearance: She is well-developed.  HENT:     Head: Normocephalic and atraumatic.     Right Ear: External ear normal.     Left Ear: External ear normal.     Nose: Nose normal.  Eyes:     Conjunctiva/sclera: Conjunctivae normal.  Cardiovascular:     Rate and Rhythm: Normal rate and regular rhythm.     Heart sounds: No murmur heard. Pulmonary:     Effort: No respiratory distress.     Breath sounds: No wheezing, rhonchi or rales.  Abdominal:     Palpations: Abdomen is soft.     Tenderness: There is no abdominal tenderness. There is no guarding or rebound.  Musculoskeletal:     Cervical back: Normal range of motion and neck supple.     Right lower leg: No edema.     Left lower leg: No edema.  Skin:    General: Skin is warm and dry.      Findings: No rash.  Neurological:     General: No focal deficit present.     Mental Status: She is alert. Mental status is at baseline.     Motor: No weakness.  Psychiatric:        Attention and Perception: She does not perceive auditory or visual hallucinations.        Mood and Affect: Mood normal.        Speech: Speech normal.        Behavior: Behavior normal.        Thought Content: Thought content normal. Thought content does not include homicidal or suicidal ideation.     Comments: Patient does not appear to be responding to internal stimuli     ED Results / Procedures / Treatments   Labs (all labs ordered are listed, but only abnormal results are displayed) Labs Reviewed - No data to display  EKG None  Radiology No results found.  Procedures Procedures    Medications Ordered in ED Medications - No data to display  ED Course/ Medical Decision Making/ A&P    Patient seen and examined. History obtained directly from patient.  Labs/EKG: None ordered.   Imaging: None ordered.   Medications/Fluids: None ordered.   Most recent vital signs reviewed and are as follows: BP 139/90 (BP Location: Right Arm)   Pulse 85   Temp 98.8 F (37.1 C) (Oral)   Resp 16   SpO2 95%   Initial impression: Polysubstance abuse.  Patient without any significant withdrawal symptoms at this time.  She will be provided resources.  Apologized to patient that we do not have inpatient detox services or the ability to get her placed to the emergency department.  Patient urged to return with worsening symptoms or other concerns. Patient verbalized understanding and agrees with plan.                           Medical Decision Making  Well-appearing patient.  Denies alcohol use.  Her main concern is opiate withdrawal.  Currently asymptomatic.  Vital signs are reassuring.  Patient will be given information regarding outpatient detox services.  The patient's vital signs, pertinent lab work  and imaging were reviewed and interpreted as discussed in the ED course. Hospitalization was considered for further testing, treatments, or serial exams/observation. However as patient is well-appearing, has a stable exam, and reassuring studies today, I do not feel that they warrant admission at this time. This plan was discussed with the patient who verbalizes agreement and comfort with this plan and seems reliable and able to return to the Emergency Department with worsening or changing symptoms.          Final Clinical Impression(s) / ED Diagnoses Final diagnoses:  Polysubstance abuse [facility])    Rx / DC Orders ED Discharge Orders     None         Renne Crigler, PA-C 08/20/21 1334    Terald Sleeper, MD 08/20/21 1419

## 2021-08-20 NOTE — ED Triage Notes (Signed)
Pt reports she is here to detox from heroin and crack cocaine. Pt denies any symptoms at this time.

## 2021-08-20 NOTE — ED Provider Notes (Signed)
Behavioral Health Urgent Care Medical Screening Exam  Patient Name: Gail Weber MRN: [id number] Date of Evaluation: 08/20/21 Chief Complaint:   Diagnosis:  Final diagnoses:  Cocaine use disorder (HCC)  Opioid use disorder    History of Present illness: Gail Weber is a 36 y.o. female. Patient presents voluntarily to [facility] behavioral health for walk-in assessment.  Patient is accompanied by her brother and her son who do not remain present during assessment.  Patient is assessed, face-to-face, by nurse practitioner, seated in assessment area, no acute distress.  She  is alert and oriented, pleasant and cooperative during assessment.   Gail Weber is seeking residential substance use treatment today.  She reports 10-year history of using heroin most days, ingested heroin by smoking.  Last heroin use on yesterday.  She also uses crack cocaine most days, ingests by smoking.  Last crack cocaine use on yesterday.  She denies alcohol use, she denies substance use aside from marijuana and crack cocaine.  She denies history of seeking substance use treatment.  She reports she would like to seek substance use treatment today as she would like to stop using substance and because "it is important to my brother and my son."  She also uses nicotine cigarettes daily.  Patient offered [facility] admission, she would prefer to discharge home and seek residential substance use treatment at her own pace.  She prefers to seek residential substance use treatment facility that allows tobacco cigarette smoking.  She would like to spend more time with her son and "take care of some things at home" before entering treatment.  She denies personal mental health history.  No current medications.  She denies history of inpatient psychiatric hospitalization.  Family mental health history includes patient's mother who is diagnosed with schizophrenia, bipolar type.  Patient  presents with euthymic mood, congruent affect. She   denies suicidal and homicidal ideations.  She endorses 1 previous suicide attempt, approximately 1 year ago when she jumped from a balcony while in jail.  Patient reports she felt overwhelmed after being away from her son for 5 days.  She easily contracts verbally for safety at this time.   Denies history of nonsuicidal self-harm behavior.    Patient has normal speech and behavior.  She  denies auditory and visual hallucinations.  Patient is able to converse coherently with goal-directed thoughts and no distractibility or preoccupation.  Denies symptoms of paranoia.  Objectively there is no evidence of psychosis/mania or delusional thinking.  Gail Weber resides alone in [place].  Her 6 year old son moved in with her brother approximately 6 months ago.  She is currently not employed.  Patient endorses decreased sleep and appetite.  She states "I sleep best when I am high."  Patient offered support and encouragement.   Patient and family are educated and verbalize understanding of mental health resources and other crisis services in the community. They are instructed to call 911 and present to the nearest emergency room should patient experience any suicidal/homicidal ideation, auditory/visual/hallucinations, or detrimental worsening of mental health condition.     Psychiatric Specialty Exam  Presentation  General Appearance:Appropriate for Environment; Casual  Eye Contact:Good  Speech:Clear and Coherent; Normal Rate  Speech Volume:Normal  Handedness:Right   Mood and Affect  Mood:Euthymic  Affect:Appropriate; Congruent   Thought Process  Thought Processes:Coherent; Goal Directed; Linear  Descriptions of Associations:Intact  Orientation:Full (Time, Place and Person)  Thought Content:Logical; WDL    Hallucinations:None  Ideas of Reference:None  Suicidal Thoughts:No  Homicidal Thoughts:No   Sensorium  Memory:Immediate Good; Recent  Good  Judgment:Fair  Insight:Fair   Executive Functions  Concentration:Good  Attention Span:Good  Recall:Good  Fund of Knowledge:Good  Language:No data recorded  Psychomotor Activity  Psychomotor Activity:Normal   Assets  Assets:Communication Skills; Desire for Improvement; Financial Resources/Insurance; Housing; Intimacy; Leisure Time; Physical Health; Resilience; Social Support   Sleep  Sleep:Good  Number of hours: No data recorded  No data recorded  Physical Exam: Physical Exam Vitals and nursing note reviewed.  Constitutional:      Appearance: Normal appearance. She is well-developed.  HENT:     Head: Normocephalic and atraumatic.     Nose: Nose normal.  Cardiovascular:     Rate and Rhythm: Normal rate.  Pulmonary:     Effort: Pulmonary effort is normal.  Musculoskeletal:        General: Normal range of motion.     Cervical back: Normal range of motion.  Skin:    General: Skin is warm and dry.  Neurological:     Mental Status: She is alert and oriented to person, place, and time.  Psychiatric:        Attention and Perception: Attention and perception normal.        Mood and Affect: Mood and affect normal.        Speech: Speech normal.        Behavior: Behavior normal. Behavior is cooperative.        Thought Content: Thought content normal.        Cognition and Memory: Cognition and memory normal.        Judgment: Judgment normal.    Review of Systems  Constitutional: Negative.   HENT: Negative.    Eyes: Negative.   Respiratory: Negative.    Cardiovascular: Negative.   Gastrointestinal: Negative.   Genitourinary: Negative.   Musculoskeletal: Negative.   Skin: Negative.   Neurological: Negative.   Psychiatric/Behavioral:  Positive for substance abuse.    Blood pressure 120/77, pulse 78, temperature 99.1 F (37.3 C), temperature source Oral, resp. rate 18, SpO2 99 %, unknown if currently breastfeeding. There is no height or weight on file  to calculate BMI.  Musculoskeletal: Strength & Muscle Tone: within normal limits Gait & Station: normal Patient leans: N/A   [facility] MSE Discharge Disposition for Follow up and Recommendations: Based on my evaluation the patient does not appear to have an emergency medical condition and can be discharged with resources and follow up care in outpatient services for Medication Management, Substance Abuse Intensive Outpatient Program, and Individual Therapy Patient reviewed with Dr. Nelly Rout. Follow-up with outpatient mental health, resources provided. Follow-up with substance use treatment resources provided.  Lenard Lance, FNP 08/20/2021, 3:28 PM

## 2021-08-20 NOTE — ED Provider Triage Note (Signed)
Emergency Medicine Provider Triage Evaluation Note  Gail Weber , a 36 y.o. female  was evaluated in triage.  Pt complains of heroin and crack use. The patient wants to be detoxed. Denies any SI/HI.   Review of Systems  Positive:  Negative:   Physical Exam  BP 139/90 (BP Location: Right Arm)   Pulse 85   Temp 98.8 F (37.1 C) (Oral)   Resp 16   SpO2 95%  Gen:   Awake, no distress   Resp:  Normal effort  MSK:   Moves extremities without difficulty  Other:  Denies SI/HI.  Medical Decision Making  Medically screening exam initiated at 11:33 AM.  Appropriate orders placed.  Gail Weber was informed that the remainder of the evaluation will be completed by another provider, this initial triage assessment does not replace that evaluation, and the importance of remaining in the ED until their evaluation is complete.     Achille Rich, PA-C 08/20/21 1134

## 2022-07-11 IMAGING — DX DG CHEST 2V
2 series · 2 of 2 positions shown · non-contrast
Comparison: None.

CLINICAL DATA: Chest pain.

EXAM:
CHEST - 2 VIEW

[chest pa]
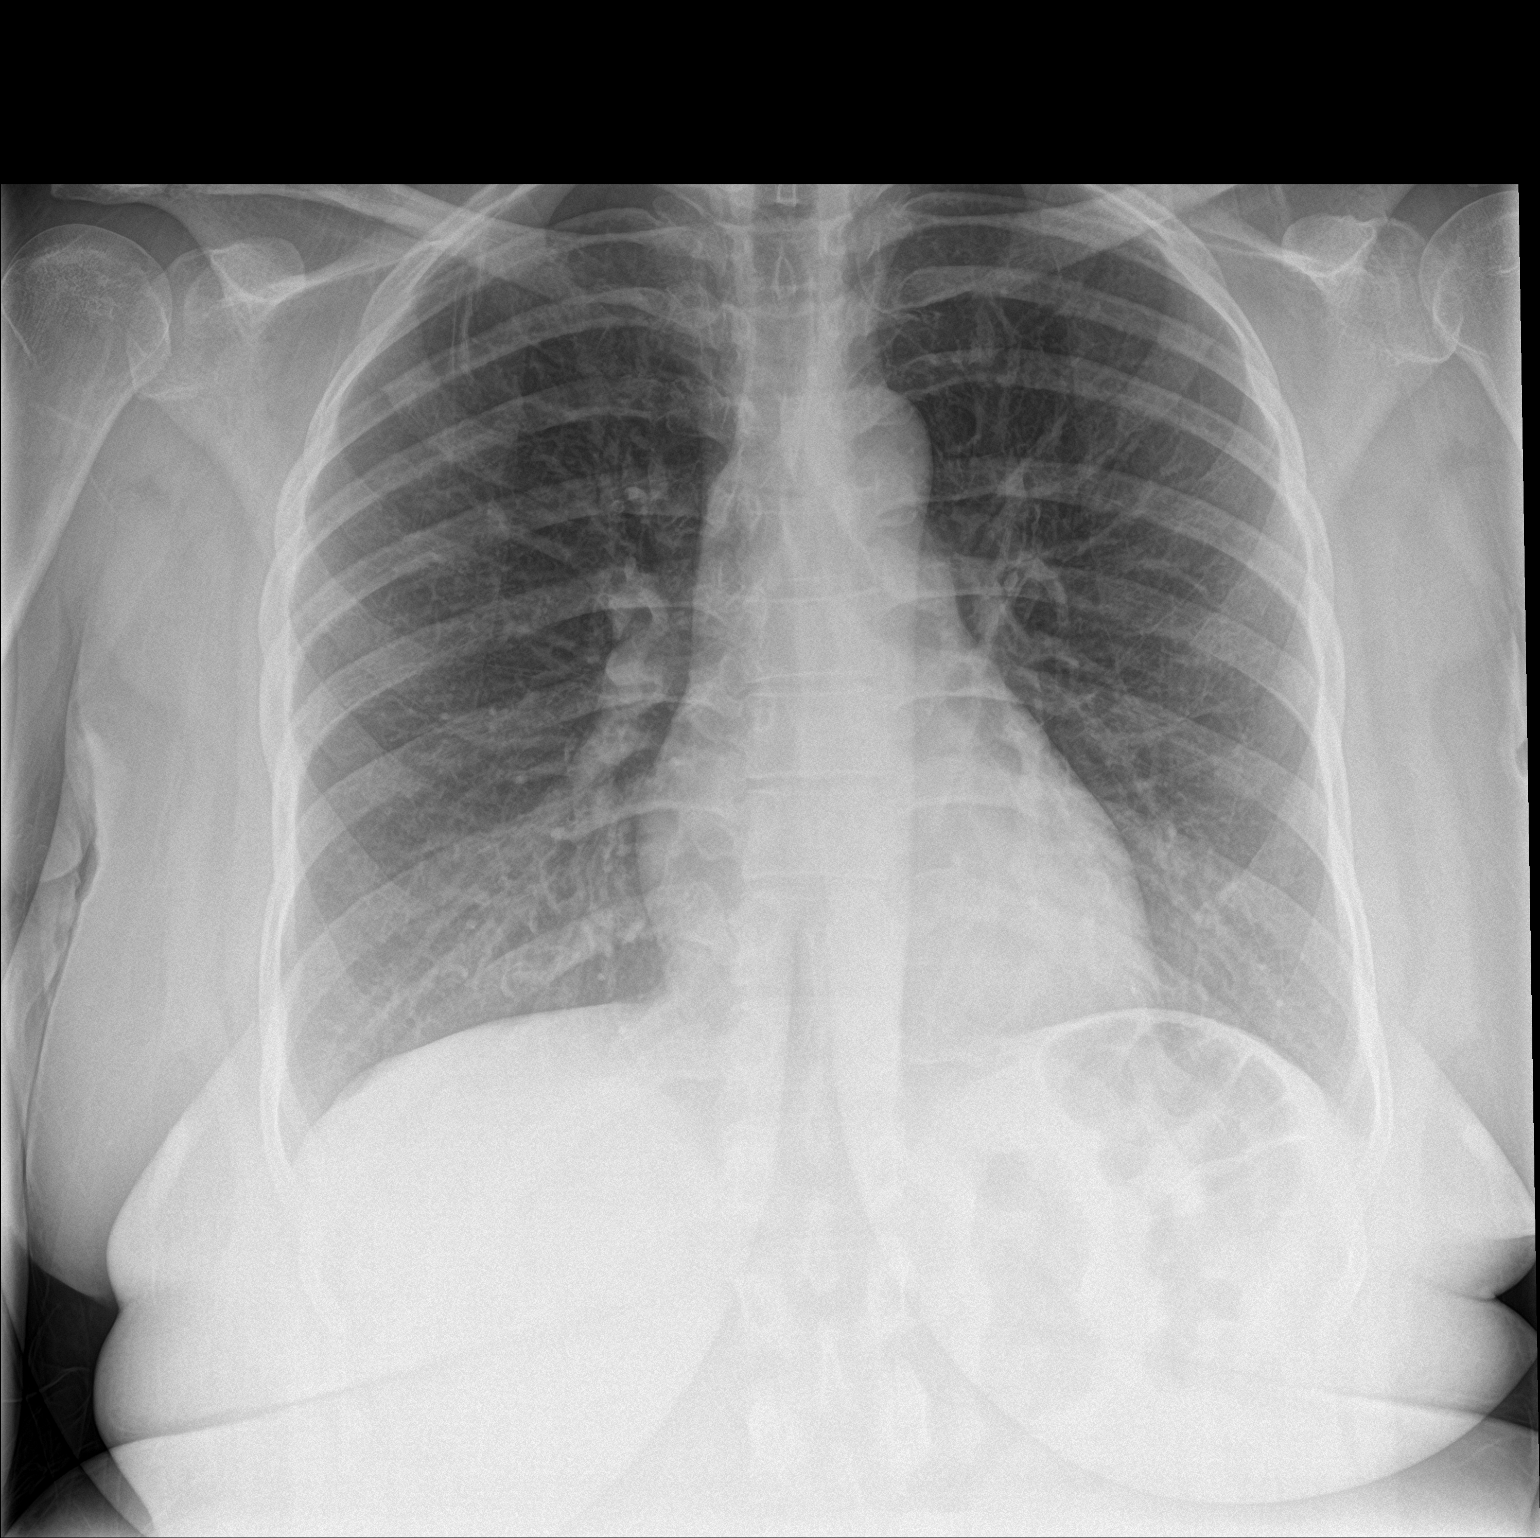

[chest lat]
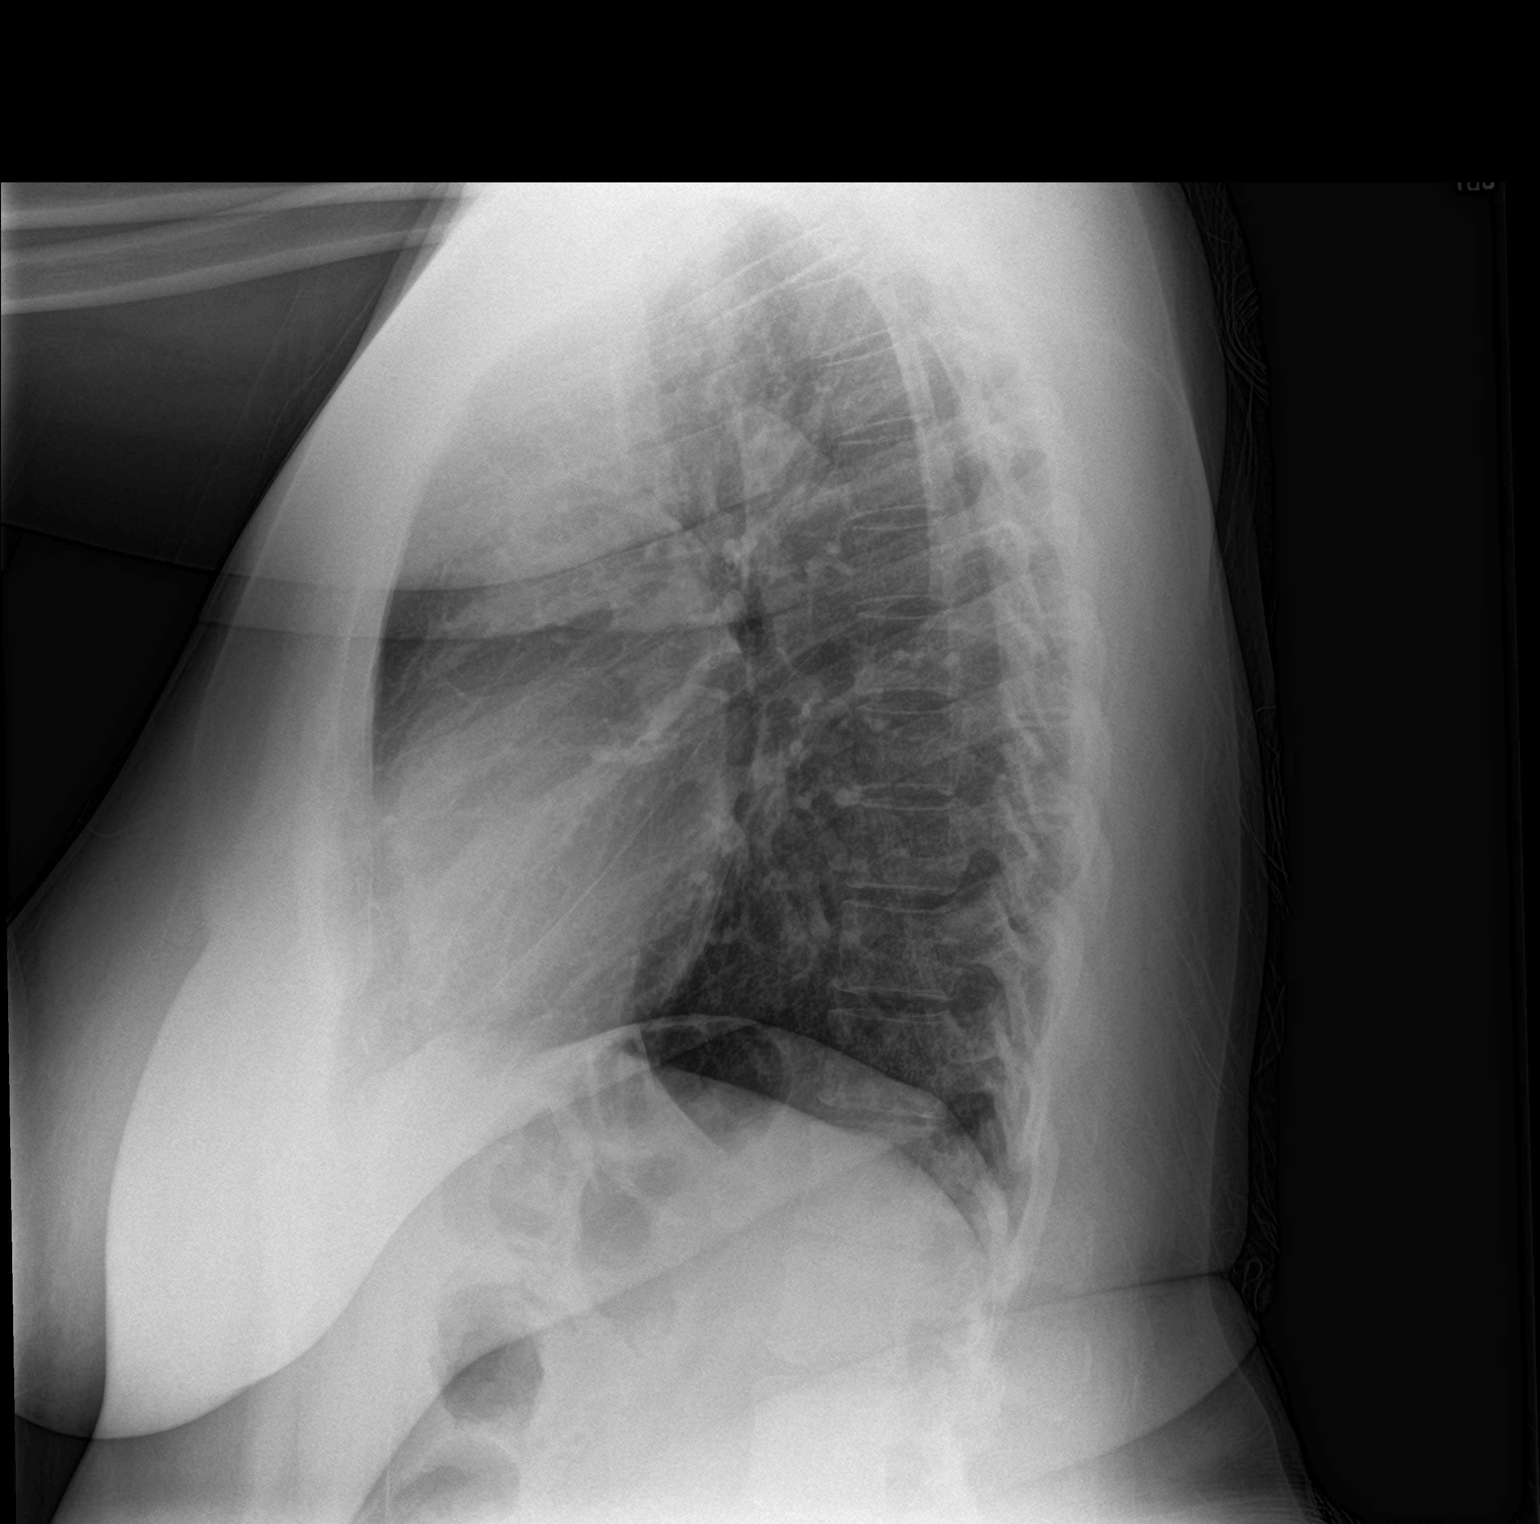

[2 of 2 positions shown; findings below may reference images not displayed]

FINDINGS: The heart size and mediastinal contours are within normal limits.
Both lungs are clear. The visualized skeletal structures are
unremarkable.
IMPRESSION: No active cardiopulmonary disease.

## 2023-04-23 ENCOUNTER — Encounter (HOSPITAL_COMMUNITY): Payer: Self-pay

## 2023-04-23 ENCOUNTER — Emergency Department (HOSPITAL_COMMUNITY): Payer: Self-pay

## 2023-04-23 ENCOUNTER — Inpatient Hospital Stay (HOSPITAL_COMMUNITY): Payer: MEDICAID | Admitting: Anesthesiology

## 2023-04-23 ENCOUNTER — Inpatient Hospital Stay (HOSPITAL_COMMUNITY): Payer: MEDICAID

## 2023-04-23 ENCOUNTER — Other Ambulatory Visit: Payer: Self-pay

## 2023-04-23 ENCOUNTER — Inpatient Hospital Stay (HOSPITAL_COMMUNITY)
Admission: EM | Admit: 2023-04-23 | Discharge: 2023-04-26 | DRG: 141 | Payer: MEDICAID | Attending: Internal Medicine | Admitting: Internal Medicine

## 2023-04-23 ENCOUNTER — Inpatient Hospital Stay (HOSPITAL_COMMUNITY): Payer: Self-pay

## 2023-04-23 ENCOUNTER — Encounter (HOSPITAL_COMMUNITY): Admission: EM | Payer: Self-pay | Source: Home / Self Care | Attending: Internal Medicine

## 2023-04-23 ENCOUNTER — Inpatient Hospital Stay (HOSPITAL_COMMUNITY): Payer: Self-pay | Admitting: Anesthesiology

## 2023-04-23 DIAGNOSIS — S022XXA Fracture of nasal bones, initial encounter for closed fracture: Principal | ICD-10-CM | POA: Diagnosis present

## 2023-04-23 DIAGNOSIS — D62 Acute posthemorrhagic anemia: Secondary | ICD-10-CM | POA: Diagnosis not present

## 2023-04-23 DIAGNOSIS — Z8249 Family history of ischemic heart disease and other diseases of the circulatory system: Secondary | ICD-10-CM

## 2023-04-23 DIAGNOSIS — Z818 Family history of other mental and behavioral disorders: Secondary | ICD-10-CM | POA: Diagnosis not present

## 2023-04-23 DIAGNOSIS — D75839 Thrombocytosis, unspecified: Secondary | ICD-10-CM | POA: Diagnosis present

## 2023-04-23 DIAGNOSIS — E66811 Obesity, class 1: Secondary | ICD-10-CM | POA: Diagnosis present

## 2023-04-23 DIAGNOSIS — F1910 Other psychoactive substance abuse, uncomplicated: Secondary | ICD-10-CM | POA: Diagnosis present

## 2023-04-23 DIAGNOSIS — E876 Hypokalemia: Secondary | ICD-10-CM | POA: Diagnosis not present

## 2023-04-23 DIAGNOSIS — S02601A Fracture of unspecified part of body of right mandible, initial encounter for closed fracture: Secondary | ICD-10-CM | POA: Diagnosis present

## 2023-04-23 DIAGNOSIS — Z6835 Body mass index (BMI) 35.0-35.9, adult: Secondary | ICD-10-CM | POA: Diagnosis not present

## 2023-04-23 DIAGNOSIS — S02651A Fracture of angle of right mandible, initial encounter for closed fracture: Secondary | ICD-10-CM | POA: Diagnosis present

## 2023-04-23 DIAGNOSIS — J45909 Unspecified asthma, uncomplicated: Secondary | ICD-10-CM | POA: Diagnosis present

## 2023-04-23 DIAGNOSIS — M2624 Reverse articulation: Secondary | ICD-10-CM | POA: Diagnosis present

## 2023-04-23 DIAGNOSIS — Z79899 Other long term (current) drug therapy: Secondary | ICD-10-CM

## 2023-04-23 DIAGNOSIS — D509 Iron deficiency anemia, unspecified: Secondary | ICD-10-CM | POA: Diagnosis present

## 2023-04-23 DIAGNOSIS — Z91040 Latex allergy status: Secondary | ICD-10-CM

## 2023-04-23 DIAGNOSIS — F17210 Nicotine dependence, cigarettes, uncomplicated: Secondary | ICD-10-CM | POA: Diagnosis present

## 2023-04-23 DIAGNOSIS — K011 Impacted teeth: Secondary | ICD-10-CM | POA: Diagnosis present

## 2023-04-23 DIAGNOSIS — J3489 Other specified disorders of nose and nasal sinuses: Secondary | ICD-10-CM | POA: Diagnosis present

## 2023-04-23 DIAGNOSIS — F1410 Cocaine abuse, uncomplicated: Secondary | ICD-10-CM | POA: Diagnosis present

## 2023-04-23 DIAGNOSIS — Z9889 Other specified postprocedural states: Secondary | ICD-10-CM

## 2023-04-23 DIAGNOSIS — S0266XA Fracture of symphysis of mandible, initial encounter for closed fracture: Secondary | ICD-10-CM | POA: Diagnosis present

## 2023-04-23 DIAGNOSIS — R040 Epistaxis: Secondary | ICD-10-CM | POA: Diagnosis present

## 2023-04-23 DIAGNOSIS — S02609A Fracture of mandible, unspecified, initial encounter for closed fracture: Secondary | ICD-10-CM | POA: Diagnosis present

## 2023-04-23 LAB — URINALYSIS, ROUTINE W REFLEX MICROSCOPIC
Bilirubin Urine: NEGATIVE
Glucose, UA: NEGATIVE mg/dL
Hgb urine dipstick: NEGATIVE
Ketones, ur: NEGATIVE mg/dL
Leukocytes,Ua: NEGATIVE
Nitrite: NEGATIVE
Protein, ur: NEGATIVE mg/dL
Specific Gravity, Urine: 1.008 (ref 1.005–1.030)
pH: 7 (ref 5.0–8.0)

## 2023-04-23 LAB — TYPE AND SCREEN
ABO/RH(D): A POS
Antibody Screen: NEGATIVE

## 2023-04-23 LAB — I-STAT CHEM 8, ED
BUN: 23 mg/dL — ABNORMAL HIGH (ref 6–20)
Calcium, Ion: 1.02 mmol/L — ABNORMAL LOW (ref 1.15–1.40)
Chloride: 105 mmol/L (ref 98–111)
Creatinine, Ser: 0.8 mg/dL (ref 0.44–1.00)
Glucose, Bld: 90 mg/dL (ref 70–99)
HCT: 39 % (ref 36.0–46.0)
Hemoglobin: 13.3 g/dL (ref 12.0–15.0)
Potassium: 4.9 mmol/L (ref 3.5–5.1)
Sodium: 137 mmol/L (ref 135–145)
TCO2: 25 mmol/L (ref 22–32)

## 2023-04-23 LAB — CBC WITH DIFFERENTIAL/PLATELET
Abs Immature Granulocytes: 0.03 K/uL (ref 0.00–0.07)
Basophils Absolute: 0 K/uL (ref 0.0–0.1)
Basophils Relative: 1 %
Eosinophils Absolute: 0.5 K/uL (ref 0.0–0.5)
Eosinophils Relative: 6 %
HCT: 37.2 % (ref 36.0–46.0)
Hemoglobin: 10.9 g/dL — ABNORMAL LOW (ref 12.0–15.0)
Immature Granulocytes: 0 %
Lymphocytes Relative: 25 %
Lymphs Abs: 2.2 K/uL (ref 0.7–4.0)
MCH: 21.6 pg — ABNORMAL LOW (ref 26.0–34.0)
MCHC: 29.3 g/dL — ABNORMAL LOW (ref 30.0–36.0)
MCV: 73.7 fL — ABNORMAL LOW (ref 80.0–100.0)
Monocytes Absolute: 1 K/uL (ref 0.1–1.0)
Monocytes Relative: 11 %
Neutro Abs: 5.1 K/uL (ref 1.7–7.7)
Neutrophils Relative %: 57 %
Platelets: 444 K/uL — ABNORMAL HIGH (ref 150–400)
RBC: 5.05 MIL/uL (ref 3.87–5.11)
RDW: 15.8 % — ABNORMAL HIGH (ref 11.5–15.5)
WBC: 8.8 K/uL (ref 4.0–10.5)
nRBC: 0 % (ref 0.0–0.2)

## 2023-04-23 LAB — RAPID URINE DRUG SCREEN, HOSP PERFORMED
Amphetamines: NOT DETECTED
Barbiturates: NOT DETECTED
Benzodiazepines: POSITIVE — AB
Cocaine: POSITIVE — AB
Opiates: NOT DETECTED
Tetrahydrocannabinol: NOT DETECTED

## 2023-04-23 LAB — BASIC METABOLIC PANEL WITH GFR
Anion gap: 11 (ref 5–15)
BUN: 17 mg/dL (ref 6–20)
CO2: 20 mmol/L — ABNORMAL LOW (ref 22–32)
Calcium: 9 mg/dL (ref 8.9–10.3)
Chloride: 105 mmol/L (ref 98–111)
Creatinine, Ser: 0.82 mg/dL (ref 0.44–1.00)
GFR, Estimated: >60 mL/min
Glucose, Bld: 91 mg/dL (ref 70–99)
Potassium: 4.4 mmol/L (ref 3.5–5.1)
Sodium: 136 mmol/L (ref 135–145)

## 2023-04-23 LAB — IRON AND TIBC
Iron: 61 ug/dL (ref 28–170)
Saturation Ratios: 14 % (ref 10.4–31.8)
TIBC: 438 ug/dL (ref 250–450)
UIBC: 377 ug/dL

## 2023-04-23 LAB — PROTIME-INR
INR: 1.1 (ref 0.8–1.2)
Prothrombin Time: 14.2 s (ref 11.4–15.2)

## 2023-04-23 LAB — FERRITIN: Ferritin: 7 ng/mL — ABNORMAL LOW (ref 11–307)

## 2023-04-23 LAB — HCG, SERUM, QUALITATIVE: Preg, Serum: NEGATIVE

## 2023-04-23 LAB — HIV ANTIBODY (ROUTINE TESTING W REFLEX): HIV Screen 4th Generation wRfx: NONREACTIVE

## 2023-04-23 LAB — APTT: aPTT: 28 s (ref 24–36)

## 2023-04-23 SURGERY — CANCELLED PROCEDURE
Anesthesia: General

## 2023-04-23 MED ORDER — OXYCODONE HCL 5 MG PO TABS
5.0000 mg | ORAL_TABLET | Freq: Four times a day (QID) | ORAL | Status: DC | PRN
  Administered 2023-04-24 – 2023-04-25 (×3): 5 mg via ORAL
  Filled 2023-04-23 (×3): qty 1

## 2023-04-23 MED ORDER — KETOROLAC TROMETHAMINE 30 MG/ML IJ SOLN: 30.0000 mg | Freq: Four times a day (QID) | INTRAMUSCULAR | Status: DC | PRN

## 2023-04-23 MED ORDER — ENOXAPARIN SODIUM 40 MG/0.4ML IJ SOSY
40.0000 mg | PREFILLED_SYRINGE | Freq: Once | INTRAMUSCULAR | Status: AC
Start: 2023-04-23 — End: 2023-04-23
  Administered 2023-04-23: 40 mg via SUBCUTANEOUS
  Filled 2023-04-23: qty 0.4

## 2023-04-23 MED ORDER — HYDROMORPHONE HCL 1 MG/ML IJ SOLN: 1.0000 mg | INTRAMUSCULAR | Status: DC | PRN

## 2023-04-23 MED ORDER — CEFAZOLIN SODIUM-DEXTROSE 1-4 GM/50ML-% IV SOLN
1.0000 g | Freq: Three times a day (TID) | INTRAVENOUS | Status: DC
  Administered 2023-04-23 – 2023-04-26 (×9): 1 g via INTRAVENOUS
  Filled 2023-04-23 (×11): qty 50

## 2023-04-23 MED ORDER — OXYCODONE HCL 5 MG PO TABS: 5.0000 mg | ORAL_TABLET | ORAL | Status: DC | PRN

## 2023-04-23 MED ORDER — ACETAMINOPHEN 650 MG RE SUPP: 650.0000 mg | Freq: Three times a day (TID) | RECTAL | Status: DC

## 2023-04-23 MED ORDER — FENTANYL CITRATE (PF) 250 MCG/5ML IJ SOLN
INTRAMUSCULAR | Status: AC
  Filled 2023-04-23: qty 5

## 2023-04-23 MED ORDER — PROPOFOL 10 MG/ML IV BOLUS
INTRAVENOUS | Status: AC
  Filled 2023-04-23: qty 20

## 2023-04-23 MED ORDER — OXYMETAZOLINE HCL 0.05 % NA SOLN
NASAL | Status: AC
  Filled 2023-04-23: qty 30

## 2023-04-23 MED ORDER — SODIUM CHLORIDE 0.9 % IV BOLUS
1000.0000 mL | Freq: Once | INTRAVENOUS | Status: AC
  Administered 2023-04-23: 1000 mL via INTRAVENOUS

## 2023-04-23 MED ORDER — ORAL CARE MOUTH RINSE: 15.0000 mL | Freq: Once | OROMUCOSAL | Status: DC

## 2023-04-23 MED ORDER — ACETAMINOPHEN 500 MG PO TABS
1000.0000 mg | ORAL_TABLET | Freq: Three times a day (TID) | ORAL | Status: DC
  Administered 2023-04-23 – 2023-04-26 (×7): 1000 mg via ORAL
  Filled 2023-04-23 (×7): qty 2

## 2023-04-23 MED ORDER — SENNOSIDES-DOCUSATE SODIUM 8.6-50 MG PO TABS: 1.0000 | ORAL_TABLET | Freq: Every evening | ORAL | Status: DC | PRN

## 2023-04-23 MED ORDER — MIDAZOLAM HCL 2 MG/2ML IJ SOLN
INTRAMUSCULAR | Status: AC
  Filled 2023-04-23: qty 2

## 2023-04-23 MED ORDER — CEFAZOLIN SODIUM-DEXTROSE 2-4 GM/100ML-% IV SOLN
2.0000 g | Freq: Once | INTRAVENOUS | Status: AC
  Administered 2023-04-23: 2 g via INTRAVENOUS
  Filled 2023-04-23: qty 100

## 2023-04-23 MED ORDER — LACTATED RINGERS IV SOLN: INTRAVENOUS | Status: DC

## 2023-04-23 MED ORDER — HYDROMORPHONE HCL 1 MG/ML IJ SOLN
1.0000 mg | Freq: Once | INTRAMUSCULAR | Status: AC
  Administered 2023-04-23: 1 mg via INTRAVENOUS
  Filled 2023-04-23 (×2): qty 1

## 2023-04-23 MED ORDER — CHLORHEXIDINE GLUCONATE 0.12 % MT SOLN
15.0000 mL | Freq: Once | OROMUCOSAL | Status: DC
Start: 2023-04-23 — End: 2023-04-23
  Filled 2023-04-23: qty 15

## 2023-04-23 MED ORDER — LIDOCAINE-EPINEPHRINE 1 %-1:100000 IJ SOLN
INTRAMUSCULAR | Status: AC
  Filled 2023-04-23: qty 1

## 2023-04-23 MED ORDER — ACETAMINOPHEN 500 MG PO TABS
1000.0000 mg | ORAL_TABLET | Freq: Once | ORAL | Status: DC
  Filled 2023-04-23: qty 2

## 2023-04-23 MED ORDER — IOHEXOL 350 MG/ML SOLN
75.0000 mL | Freq: Once | INTRAVENOUS | Status: AC | PRN
  Administered 2023-04-23: 75 mL via INTRAVENOUS

## 2023-04-23 MED ORDER — CEFAZOLIN SODIUM-DEXTROSE 1-4 GM/50ML-% IV SOLN: 1.0000 g | Freq: Three times a day (TID) | INTRAVENOUS | Status: DC

## 2023-04-23 NOTE — Discharge Instructions (Signed)

## 2023-04-23 NOTE — ED Notes (Signed)
 Pt sleeping. [employer] present. Pending arrival of day shift IV team. No IV access at this time. Pending CT, IVF and meds after IV established.

## 2023-04-23 NOTE — ED Notes (Signed)
 Back from CT, no changes, [employer] present

## 2023-04-23 NOTE — Plan of Care (Signed)
 Pt admitted from the ED Facial swelling noted. HOB elevated. Pt very sleepy. Police outside pt door.

## 2023-04-23 NOTE — Anesthesia Preprocedure Evaluation (Addendum)
 Anesthesia Evaluation    Reviewed: Allergy & Precautions, Patient's Chart, lab work & pertinent test results  Airway        Dental   Pulmonary asthma , Current Smoker          Cardiovascular negative cardio ROS      Neuro/Psych negative neurological ROS  negative psych ROS   GI/Hepatic negative GI ROS,,,(+)     substance abuse  cocaine use and marijuana use  Endo/Other  negative endocrine ROS    Renal/GU negative Renal ROS  negative genitourinary   Musculoskeletal negative musculoskeletal ROS (+)    Abdominal   Peds  Hematology negative hematology ROS (+)   Anesthesia Other Findings   Reproductive/Obstetrics                             Anesthesia Physical Anesthesia Plan  ASA: 2  Anesthesia Plan: General   Post-op Pain Management: Tylenol PO (pre-op)*, Dilaudid IV and Precedex   Induction: Intravenous  PONV Risk Score and Plan: 2 and Midazolam, Dexamethasone and Ondansetron  Airway Management Planned: Oral ETT and Video Laryngoscope Planned  Additional Equipment:   Intra-op Plan:   Post-operative Plan: Extubation in OR  Informed Consent: I have reviewed the patients History and Physical, chart, labs and discussed the procedure including the risks, benefits and alternatives for the proposed anesthesia with the patient or authorized representative who has indicated his/her understanding and acceptance.     Dental advisory given  Plan Discussed with: CRNA  Anesthesia Plan Comments:        Anesthesia Quick Evaluation

## 2023-04-23 NOTE — ED Notes (Signed)
 Sleeping, restless agitation, answers some questions and some commands, inconsistent, poor effort, reluctant, preoccupied with sleep/ comfort. Requests warm blanket, given. VSS. Pending CT. IVF bolus infusing on pressure bag.

## 2023-04-23 NOTE — ED Provider Notes (Signed)
  Physical Exam  BP (!) 140/93   Pulse 79   Temp 98.7 F (37.1 C) (Oral)   Resp 18   Ht 5\' 7"  (1.702 m)   Wt 104 kg   LMP  (LMP Unknown)   SpO2 98%   BMI 35.91 kg/m   Physical Exam Vitals and nursing note reviewed.  HENT:     Head:     Comments: Facial swelling most prominent over right mandible Airway intact Eyes:     Pupils: Pupils are equal, round, and reactive to light.  Cardiovascular:     Rate and Rhythm: Normal rate and regular rhythm.  Pulmonary:     Effort: Pulmonary effort is normal.     Breath sounds: Normal breath sounds.  Abdominal:     Palpations: Abdomen is soft.     Tenderness: There is no abdominal tenderness.  Skin:    General: Skin is warm and dry.  Neurological:     Mental Status: She is alert.  Psychiatric:        Mood and Affect: Mood normal.     Procedures  Procedures  ED Course / MDM   Clinical Course as of 04/23/23 1103  Mon Apr 23, 2023  0727 CBC without leukocytosis.  Mild anemia.  Metabolic panel without significant electrolyte derangements or renal sufficiency.  hCG obtained to guide care and for imaging which was negative.  Currently awaiting imaging.  Patient care turned over to oncoming provider. Patient case and results discussed in detail; please see their note for further ED managment.     [PC]  1049 CT reveals right mandibular fracture.  Discussed with facial trauma on-call provider (Dr. Ross Marcus DDS).  Patient will need operative fixation today.  Will plan for OR later this afternoon and then admit to medicine for postoperative care.  I informed patient of this plan and she is agreeable.  Will start Ancef, obtain preop EKG and coagulation studies.  Paged hospitalist [MP]  1103 Discussed with admitting hospitalist who accept patient for admission to medicine [MP]    Clinical Course User Index [MP] Royanne Foots, DO [PC] Cardama, Amadeo Garnet, MD   Medical Decision Making I, Estelle June DO, have assumed care of this  patient from the previous provider CT to evaluate for traumatic injury, reevaluation and disposition  Amount and/or Complexity of Data Reviewed Labs: ordered. Radiology: ordered.  Risk Prescription drug management.          Royanne Foots, DO 04/23/23 1052

## 2023-04-23 NOTE — ED Notes (Signed)
 To x-ray via stretcher.

## 2023-04-23 NOTE — ED Notes (Signed)
 IV team at [facility]. [employer] present.

## 2023-04-23 NOTE — ED Notes (Signed)
 Up to b/r by w/c with stand-by assist. C/o severe R facial pain. Semi-steady gait. CT next.

## 2023-04-23 NOTE — ED Notes (Signed)
 Belongings bagged and labeled. Blank consent at [facility].
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
## 2023-04-23 NOTE — Progress Notes (Addendum)
 Pt is responsive to tactile stimuli, cannot stay awake. Oriented to self. Pt is not coherent. Unable to get in touch with her mother for a consent for surgery. Dr Ross Marcus came in and seen pt. Surgery is rescheduled for tom.  Will transfer pt back to 6N. Report was given to Tameika at [facility].
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
## 2023-04-23 NOTE — ED Notes (Signed)
 EDP at [facility] discussing surgery, pending surgery/ OR
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
## 2023-04-23 NOTE — Progress Notes (Signed)
 PIV team consult.  2 attempts by this IV RN unsuccessful.  Another IV nurse will assess.

## 2023-04-23 NOTE — ED Notes (Signed)
 IV established. Labs resulted. Called CT for timeframe/ ETA. CT clarifying order. CT to be done shortly. Pt sleeping.
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
## 2023-04-23 NOTE — ED Notes (Signed)
Remains in CT

## 2023-04-23 NOTE — ED Notes (Signed)
 VSS. No changes. To CT via stretcher.

## 2023-04-23 NOTE — ED Notes (Signed)
 PIV attempt x 2 w/US. Unsuccessful. IV team consult placed.

## 2023-04-24 ENCOUNTER — Encounter (HOSPITAL_COMMUNITY): Payer: Self-pay | Admitting: Student

## 2023-04-24 ENCOUNTER — Inpatient Hospital Stay (HOSPITAL_COMMUNITY): Payer: Self-pay | Admitting: Registered Nurse

## 2023-04-24 ENCOUNTER — Encounter (HOSPITAL_COMMUNITY): Admission: EM | Payer: Self-pay | Source: Home / Self Care | Attending: Internal Medicine

## 2023-04-24 ENCOUNTER — Inpatient Hospital Stay (HOSPITAL_COMMUNITY): Payer: Self-pay

## 2023-04-24 ENCOUNTER — Other Ambulatory Visit: Payer: Self-pay

## 2023-04-24 DIAGNOSIS — S022XXA Fracture of nasal bones, initial encounter for closed fracture: Secondary | ICD-10-CM | POA: Diagnosis present

## 2023-04-24 DIAGNOSIS — K011 Impacted teeth: Secondary | ICD-10-CM | POA: Diagnosis present

## 2023-04-24 DIAGNOSIS — E876 Hypokalemia: Secondary | ICD-10-CM | POA: Diagnosis not present

## 2023-04-24 DIAGNOSIS — D509 Iron deficiency anemia, unspecified: Secondary | ICD-10-CM | POA: Diagnosis present

## 2023-04-24 DIAGNOSIS — D62 Acute posthemorrhagic anemia: Secondary | ICD-10-CM | POA: Diagnosis not present

## 2023-04-24 DIAGNOSIS — Z9889 Other specified postprocedural states: Secondary | ICD-10-CM | POA: Diagnosis not present

## 2023-04-24 DIAGNOSIS — Z79899 Other long term (current) drug therapy: Secondary | ICD-10-CM | POA: Diagnosis not present

## 2023-04-24 DIAGNOSIS — Z8249 Family history of ischemic heart disease and other diseases of the circulatory system: Secondary | ICD-10-CM | POA: Diagnosis not present

## 2023-04-24 DIAGNOSIS — F17210 Nicotine dependence, cigarettes, uncomplicated: Secondary | ICD-10-CM

## 2023-04-24 DIAGNOSIS — S02651A Fracture of angle of right mandible, initial encounter for closed fracture: Secondary | ICD-10-CM | POA: Diagnosis present

## 2023-04-24 DIAGNOSIS — D75839 Thrombocytosis, unspecified: Secondary | ICD-10-CM | POA: Diagnosis present

## 2023-04-24 DIAGNOSIS — S02609A Fracture of mandible, unspecified, initial encounter for closed fracture: Secondary | ICD-10-CM

## 2023-04-24 DIAGNOSIS — E66811 Obesity, class 1: Secondary | ICD-10-CM | POA: Diagnosis present

## 2023-04-24 DIAGNOSIS — Z91040 Latex allergy status: Secondary | ICD-10-CM | POA: Diagnosis not present

## 2023-04-24 DIAGNOSIS — F1410 Cocaine abuse, uncomplicated: Secondary | ICD-10-CM | POA: Diagnosis present

## 2023-04-24 DIAGNOSIS — M2624 Reverse articulation: Secondary | ICD-10-CM | POA: Diagnosis present

## 2023-04-24 DIAGNOSIS — J45909 Unspecified asthma, uncomplicated: Secondary | ICD-10-CM

## 2023-04-24 DIAGNOSIS — Z818 Family history of other mental and behavioral disorders: Secondary | ICD-10-CM | POA: Diagnosis not present

## 2023-04-24 DIAGNOSIS — Z6835 Body mass index (BMI) 35.0-35.9, adult: Secondary | ICD-10-CM | POA: Diagnosis not present

## 2023-04-24 DIAGNOSIS — F1910 Other psychoactive substance abuse, uncomplicated: Secondary | ICD-10-CM | POA: Diagnosis present

## 2023-04-24 DIAGNOSIS — S0266XA Fracture of symphysis of mandible, initial encounter for closed fracture: Secondary | ICD-10-CM | POA: Diagnosis present

## 2023-04-24 DIAGNOSIS — S02601A Fracture of unspecified part of body of right mandible, initial encounter for closed fracture: Secondary | ICD-10-CM | POA: Diagnosis present

## 2023-04-24 LAB — CBC
HCT: 31.2 % — ABNORMAL LOW (ref 36.0–46.0)
HCT: 35.5 % — ABNORMAL LOW (ref 36.0–46.0)
Hemoglobin: 9.8 g/dL — ABNORMAL LOW (ref 12.0–15.0)
Hemoglobin: 10.9 g/dL — ABNORMAL LOW (ref 12.0–15.0)
MCH: 21.7 pg — ABNORMAL LOW (ref 26.0–34.0)
MCH: 21.5 pg — ABNORMAL LOW (ref 26.0–34.0)
MCHC: 31.4 g/dL (ref 30.0–36.0)
MCHC: 30.7 g/dL (ref 30.0–36.0)
MCV: 69.2 fL — ABNORMAL LOW (ref 80.0–100.0)
MCV: 70.2 fL — ABNORMAL LOW (ref 80.0–100.0)
Platelets: 390 10*3/uL (ref 150–400)
Platelets: 443 10*3/uL — ABNORMAL HIGH (ref 150–400)
RBC: 4.51 MIL/uL (ref 3.87–5.11)
RBC: 5.06 MIL/uL (ref 3.87–5.11)
RDW: 15.1 % (ref 11.5–15.5)
RDW: 15.5 % (ref 11.5–15.5)
WBC: 4.5 10*3/uL (ref 4.0–10.5)
WBC: 13.1 10*3/uL — ABNORMAL HIGH (ref 4.0–10.5)
nRBC: 0 % (ref 0.0–0.2)
nRBC: 0 % (ref 0.0–0.2)

## 2023-04-24 LAB — BASIC METABOLIC PANEL WITH GFR
Anion gap: 11 (ref 5–15)
Anion gap: 9 (ref 5–15)
BUN: 11 mg/dL (ref 6–20)
BUN: 13 mg/dL (ref 6–20)
CO2: 22 mmol/L (ref 22–32)
CO2: 22 mmol/L (ref 22–32)
Calcium: 8.8 mg/dL — ABNORMAL LOW (ref 8.9–10.3)
Calcium: 8.8 mg/dL — ABNORMAL LOW (ref 8.9–10.3)
Chloride: 103 mmol/L (ref 98–111)
Chloride: 103 mmol/L (ref 98–111)
Creatinine, Ser: 0.75 mg/dL (ref 0.44–1.00)
Creatinine, Ser: 0.87 mg/dL (ref 0.44–1.00)
GFR, Estimated: >60 mL/min (ref >60)
GFR, Estimated: >60 mL/min (ref >60)
Glucose, Bld: 84 mg/dL (ref 70–99)
Glucose, Bld: 150 mg/dL — ABNORMAL HIGH (ref 70–99)
Potassium: 3.3 mmol/L — ABNORMAL LOW (ref 3.5–5.1)
Potassium: 3.7 mmol/L (ref 3.5–5.1)
Sodium: 136 mmol/L (ref 135–145)
Sodium: 134 mmol/L — ABNORMAL LOW (ref 135–145)

## 2023-04-24 SURGERY — OPEN REDUCTION INTERNAL FIXATION (ORIF) MANDIBULAR FRACTURE
Anesthesia: General

## 2023-04-24 MED ORDER — HYDROMORPHONE HCL 1 MG/ML IJ SOLN
INTRAMUSCULAR | Status: AC
  Filled 2023-04-24: qty 1

## 2023-04-24 MED ORDER — CHLORHEXIDINE GLUCONATE 0.12 % MT SOLN: 15.0000 mL | Freq: Once | OROMUCOSAL | Status: DC

## 2023-04-24 MED ORDER — ONDANSETRON HCL 4 MG/2ML IJ SOLN
INTRAMUSCULAR | Status: DC | PRN
  Administered 2023-04-24: 4 mg via INTRAVENOUS

## 2023-04-24 MED ORDER — OXYCODONE HCL 5 MG PO TABS: 5.0000 mg | ORAL_TABLET | Freq: Once | ORAL | Status: DC | PRN

## 2023-04-24 MED ORDER — HYDROMORPHONE HCL 1 MG/ML IJ SOLN
INTRAMUSCULAR | Status: DC | PRN
  Administered 2023-04-24 (×2): .25 mg via INTRAVENOUS

## 2023-04-24 MED ORDER — ROCURONIUM BROMIDE 10 MG/ML (PF) SYRINGE
PREFILLED_SYRINGE | INTRAVENOUS | Status: DC | PRN
  Administered 2023-04-24: 20 mg via INTRAVENOUS
  Administered 2023-04-24: 60 mg via INTRAVENOUS

## 2023-04-24 MED ORDER — DEXAMETHASONE SODIUM PHOSPHATE 10 MG/ML IJ SOLN
INTRAMUSCULAR | Status: AC
Start: 2023-04-24 — End: ?
  Filled 2023-04-24: qty 4

## 2023-04-24 MED ORDER — SODIUM CHLORIDE 0.9 % IV SOLN: 12.5000 mg | INTRAVENOUS | Status: DC | PRN

## 2023-04-24 MED ORDER — LEVALBUTEROL HCL 0.63 MG/3ML IN NEBU: 0.6300 mg | INHALATION_SOLUTION | Freq: Four times a day (QID) | RESPIRATORY_TRACT | Status: DC | PRN

## 2023-04-24 MED ORDER — PROPOFOL 10 MG/ML IV BOLUS
INTRAVENOUS | Status: AC
  Filled 2023-04-24: qty 20

## 2023-04-24 MED ORDER — PHENYLEPHRINE 80 MCG/ML (10ML) SYRINGE FOR IV PUSH (FOR BLOOD PRESSURE SUPPORT)
PREFILLED_SYRINGE | INTRAVENOUS | Status: AC
  Filled 2023-04-24: qty 20

## 2023-04-24 MED ORDER — OXYCODONE HCL 5 MG/5ML PO SOLN: 5.0000 mg | Freq: Once | ORAL | Status: DC | PRN

## 2023-04-24 MED ORDER — HYDROMORPHONE HCL 1 MG/ML IJ SOLN
0.2500 mg | INTRAMUSCULAR | Status: DC | PRN
Start: 2023-04-24 — End: 2023-04-24
  Administered 2023-04-24: 0.5 mg via INTRAVENOUS
  Administered 2023-04-24 (×2): 0.25 mg via INTRAVENOUS

## 2023-04-24 MED ORDER — ORAL CARE MOUTH RINSE: 15.0000 mL | Freq: Once | OROMUCOSAL | Status: DC

## 2023-04-24 MED ORDER — LACTATED RINGERS IV SOLN: INTRAVENOUS | Status: DC

## 2023-04-24 MED ORDER — ONDANSETRON HCL 4 MG/2ML IJ SOLN
INTRAMUSCULAR | Status: AC
  Filled 2023-04-24: qty 8

## 2023-04-24 MED ORDER — OXYMETAZOLINE HCL 0.05 % NA SOLN
NASAL | Status: DC | PRN
  Administered 2023-04-24: 1 via TOPICAL

## 2023-04-24 MED ORDER — MIDAZOLAM HCL 2 MG/2ML IJ SOLN
INTRAMUSCULAR | Status: AC
  Filled 2023-04-24: qty 2

## 2023-04-24 MED ORDER — AMISULPRIDE (ANTIEMETIC) 5 MG/2ML IV SOLN: 10.0000 mg | Freq: Once | INTRAVENOUS | Status: DC | PRN

## 2023-04-24 MED ORDER — LIDOCAINE 2% (20 MG/ML) 5 ML SYRINGE
INTRAMUSCULAR | Status: AC
  Filled 2023-04-24: qty 20

## 2023-04-24 MED ORDER — FENTANYL CITRATE (PF) 250 MCG/5ML IJ SOLN
INTRAMUSCULAR | Status: AC
  Filled 2023-04-24: qty 5

## 2023-04-24 MED ORDER — DEXAMETHASONE SODIUM PHOSPHATE 10 MG/ML IJ SOLN
INTRAMUSCULAR | Status: DC | PRN
  Administered 2023-04-24: 10 mg via INTRAVENOUS

## 2023-04-24 MED ORDER — LIDOCAINE-EPINEPHRINE 1 %-1:100000 IJ SOLN
INTRAMUSCULAR | Status: AC
  Filled 2023-04-24: qty 1

## 2023-04-24 MED ORDER — LIDOCAINE-EPINEPHRINE 1 %-1:100000 IJ SOLN
INTRAMUSCULAR | Status: DC | PRN
  Administered 2023-04-24: 8 mL

## 2023-04-24 MED ORDER — FENTANYL CITRATE (PF) 250 MCG/5ML IJ SOLN
INTRAMUSCULAR | Status: DC | PRN
  Administered 2023-04-24 (×3): 50 ug via INTRAVENOUS
  Administered 2023-04-24: 100 ug via INTRAVENOUS

## 2023-04-24 MED ORDER — OXYMETAZOLINE HCL 0.05 % NA SOLN
NASAL | Status: AC
  Filled 2023-04-24: qty 30

## 2023-04-24 MED ORDER — ROCURONIUM BROMIDE 10 MG/ML (PF) SYRINGE
PREFILLED_SYRINGE | INTRAVENOUS | Status: AC
  Filled 2023-04-24: qty 20

## 2023-04-24 MED ORDER — PHENYLEPHRINE 80 MCG/ML (10ML) SYRINGE FOR IV PUSH (FOR BLOOD PRESSURE SUPPORT)
PREFILLED_SYRINGE | INTRAVENOUS | Status: DC | PRN
Start: 2023-04-24 — End: 2023-04-24
  Administered 2023-04-24: 120 ug via INTRAVENOUS

## 2023-04-24 MED ORDER — HYDROMORPHONE HCL 1 MG/ML IJ SOLN
INTRAMUSCULAR | Status: AC
  Filled 2023-04-24: qty 0.5

## 2023-04-24 MED ORDER — MEPERIDINE HCL 25 MG/ML IJ SOLN: 6.2500 mg | INTRAMUSCULAR | Status: DC | PRN

## 2023-04-24 MED ORDER — SUGAMMADEX SODIUM 200 MG/2ML IV SOLN
INTRAVENOUS | Status: DC | PRN
Start: 2023-04-24 — End: 2023-04-24
  Administered 2023-04-24: 200 mg via INTRAVENOUS

## 2023-04-24 MED ORDER — DEXMEDETOMIDINE HCL IN NACL 80 MCG/20ML IV SOLN
INTRAVENOUS | Status: DC | PRN
  Administered 2023-04-24: 20 ug via INTRAVENOUS
  Administered 2023-04-24: 12 ug via INTRAVENOUS
  Administered 2023-04-24: 8 ug via INTRAVENOUS

## 2023-04-24 MED ORDER — LIDOCAINE 2% (20 MG/ML) 5 ML SYRINGE
INTRAMUSCULAR | Status: DC | PRN
  Administered 2023-04-24: 100 mg via INTRAVENOUS

## 2023-04-24 MED ORDER — PROPOFOL 10 MG/ML IV BOLUS
INTRAVENOUS | Status: DC | PRN
  Administered 2023-04-24: 30 mg via INTRAVENOUS
  Administered 2023-04-24: 20 mg via INTRAVENOUS
  Administered 2023-04-24: 50 mg via INTRAVENOUS
  Administered 2023-04-24: 20 mg via INTRAVENOUS
  Administered 2023-04-24: 180 mg via INTRAVENOUS
  Administered 2023-04-24: 30 mg via INTRAVENOUS

## 2023-04-24 MED ORDER — HYDROMORPHONE HCL 1 MG/ML IJ SOLN
0.5000 mg | INTRAMUSCULAR | Status: DC | PRN
  Administered 2023-04-24 – 2023-04-26 (×7): 0.5 mg via INTRAVENOUS
  Filled 2023-04-24 (×7): qty 0.5

## 2023-04-24 SURGICAL SUPPLY — 66 items
BAG COUNTER SPONGE SURGICOUNT (BAG) ×3 IMPLANT
BIT DRILL 1.5X50 7MMSTP W/NTCH (BIT) IMPLANT
BIT DRILL DEPTH MARK1.8X115 26 (MISCELLANEOUS) IMPLANT
BIT DRILL STOP 1.8X50X26 (MISCELLANEOUS) IMPLANT
BLADE RECIPRO TAPERED (BLADE) IMPLANT
BLADE SURG 10 STRL SS (BLADE) IMPLANT
BLADE SURG 15 STRL LF DISP TIS (BLADE) IMPLANT
BUR RND DIAMOND ELITE 3.5 (BURR) IMPLANT
BUR SRG MED 1.6XXCUT FSSR (BURR) IMPLANT
BUR SURG 4X8 MED (BURR) IMPLANT
BURR SRG MED 1.6XXCUT FSSR (BURR) ×2 IMPLANT
BURR SURG 4X8 MED (BURR) IMPLANT
CANISTER SUCT 3000ML PPV (MISCELLANEOUS) ×3 IMPLANT
CLEANER TIP ELECTROSURG 2X2 (MISCELLANEOUS) ×3 IMPLANT
COVER SURGICAL LIGHT HANDLE (MISCELLANEOUS) ×3 IMPLANT
DRAPE HALF SHEET 40X57 (DRAPES) IMPLANT
DRILL 1.5X50 7MM STP W/NOTCH (BIT) ×2 IMPLANT
DRILL DEPTH MARK1.8X115 26 (MISCELLANEOUS) ×2 IMPLANT
DRILL STOP 1.8X50X26 (MISCELLANEOUS) ×2 IMPLANT
ELECT COATED BLADE 2.86 ST (ELECTRODE) ×3 IMPLANT
ELECT NDL BLADE 2-5/6 (NEEDLE) IMPLANT
ELECT NEEDLE BLADE 2-5/6 (NEEDLE) ×2 IMPLANT
ELECT REM PT RETURN 9FT ADLT (ELECTROSURGICAL) ×2 IMPLANT
ELECTRODE REM PT RTRN 9FT ADLT (ELECTROSURGICAL) ×3 IMPLANT
GLOVE BIO SURGEON STRL SZ8 (GLOVE) ×3 IMPLANT
GLOVE BIOGEL PI IND STRL 8 (GLOVE) ×3 IMPLANT
GOWN STRL REUS W/ TWL LRG LVL3 (GOWN DISPOSABLE) ×3 IMPLANT
GOWN STRL REUS W/ TWL XL LVL3 (GOWN DISPOSABLE) ×3 IMPLANT
KIT BASIN OR (CUSTOM PROCEDURE TRAY) ×3 IMPLANT
KIT TURNOVER KIT B (KITS) ×3 IMPLANT
NDL HYPO 25GX1X1/2 BEV (NEEDLE) ×3 IMPLANT
NEEDLE HYPO 25GX1X1/2 BEV (NEEDLE) ×2 IMPLANT
NS IRRIG 1000ML POUR BTL (IV SOLUTION) ×3 IMPLANT
PAD ARMBOARD POSITIONER FOAM (MISCELLANEOUS) ×6 IMPLANT
PATTIES SURGICAL .5 X3 (DISPOSABLE) IMPLANT
PENCIL BUTTON HOLSTER BLD 10FT (ELECTRODE) ×3 IMPLANT
PLATE 4H STERILE 1.0 (Plate) IMPLANT
PLATE 4H W/GAP 2.0 (Plate) IMPLANT
PLATE 4HOLE STR 1.6MM (Plate) IMPLANT
PLATE 6H STR 2.0 (Plate) IMPLANT
POSITIONER HEAD DONUT 9IN (MISCELLANEOUS) ×3 IMPLANT
PROTECTOR CORNEAL (OPHTHALMIC RELATED) IMPLANT
SCISSORS WIRE ANG 4 3/4 DISP (INSTRUMENTS) IMPLANT
SCREW NON LOCK X-DR 2.0X5 (Screw) IMPLANT
SCREW NON LOCK X-DR 2.3X10 (Screw) IMPLANT
SCREW NON LOCK X-DR 2.3X14 (Screw) IMPLANT
SCREW NON LOCK X-DR 2.3X5 (Screw) IMPLANT
SCREW SD IMF HEX 2.0X7 (Screw) IMPLANT
SCREW SD IMF HEX 2.0X9 (Screw) IMPLANT
SCREW X-DR EMERG 2.7X14 (Screw) IMPLANT
SPIKE FLUID TRANSFER (MISCELLANEOUS) ×3 IMPLANT
SUCTION TUBE FRAZIER 12FR DISP (SUCTIONS) IMPLANT
SUT CHROMIC 3 0 PS 2 (SUTURE) IMPLANT
SUT MNCRL AB 4-0 PS2 18 (SUTURE) IMPLANT
SUT NYLON ETHILON 5-0 P-3 1X18 (SUTURE) IMPLANT
SUT PROLENE 5 0 PS 2 (SUTURE) ×3 IMPLANT
SUT SILK 2 0 PERMA HAND 18 BK (SUTURE) IMPLANT
SUT STEEL 0 18XMFL TIE 17 (SUTURE) IMPLANT
SUT STEEL 4 (SUTURE) IMPLANT
SUT VIC AB 3-0 SH 27X BRD (SUTURE) ×3 IMPLANT
SUT VIC AB 4-0 RB1 27X BRD (SUTURE) IMPLANT
SUT VIC AB 4-0 SH 27XANBCTRL (SUTURE) IMPLANT
SUT VICRYL 4-0 PS2 18IN ABS (SUTURE) ×3 IMPLANT
TOWEL GREEN STERILE FF (TOWEL DISPOSABLE) ×3 IMPLANT
TRAY ENT MC OR (CUSTOM PROCEDURE TRAY) ×3 IMPLANT
WATER STERILE IRR 1000ML POUR (IV SOLUTION) ×3 IMPLANT

## 2023-04-24 NOTE — Anesthesia Preprocedure Evaluation (Signed)
 Anesthesia Evaluation    Reviewed: Allergy & Precautions, Patient's Chart, lab work & pertinent test results  Airway Mallampati: Unable to assess   Neck ROM: Full  Mouth opening: Limited Mouth Opening  Dental no notable dental hx.    Pulmonary asthma , Current Smoker and Patient abstained from smoking.   Pulmonary exam normal breath sounds clear to auscultation       Cardiovascular negative cardio ROS Normal cardiovascular exam Rhythm:Regular Rate:Normal     Neuro/Psych negative neurological ROS  negative psych ROS   GI/Hepatic negative GI ROS,,,(+)     substance abuse  cocaine use and marijuana use  Endo/Other  negative endocrine ROS    Renal/GU negative Renal ROS  negative genitourinary   Musculoskeletal negative musculoskeletal ROS (+)    Abdominal  (+) + obese  Peds  Hematology  (+) Blood dyscrasia, anemia   Anesthesia Other Findings   Reproductive/Obstetrics                             Anesthesia Physical Anesthesia Plan  ASA: 3  Anesthesia Plan: General   Post-op Pain Management: Tylenol PO (pre-op)*   Induction: Intravenous  PONV Risk Score and Plan: 2 and Midazolam, Ondansetron and Treatment may vary due to age or medical condition  Airway Management Planned: Oral ETT and Video Laryngoscope Planned  Additional Equipment:   Intra-op Plan:   Post-operative Plan: Extubation in OR  Informed Consent: I have reviewed the patients History and Physical, chart, labs and discussed the procedure including the risks, benefits and alternatives for the proposed anesthesia with the patient or authorized representative who has indicated his/her understanding and acceptance.     Dental advisory given  Plan Discussed with: CRNA  Anesthesia Plan Comments:        Anesthesia Quick Evaluation

## 2023-04-24 NOTE — Transfer of Care (Signed)
 Immediate Anesthesia Transfer of Care Note  Patient: Gail Weber  Procedure(s) Performed: OPEN REDUCTION INTERNAL FIXATION (ORIF) MANDIBULAR FRACTURE DENTAL RESTORATION/EXTRACTIONS  Patient Location: PACU  Anesthesia Type:General  Level of Consciousness: awake, drowsy, and pateint uncooperative  Airway & Oxygen Therapy: Patient Spontanous Breathing and Patient connected to face mask oxygen  Post-op Assessment: Report given to RN, Post -op Vital signs reviewed and stable, and Patient moving all extremities X 4  Post vital signs: Reviewed and stable  Last Vitals:  Vitals Value Taken Time  BP 138/84 04/24/23 1923  Temp    Pulse 96 04/24/23 1928  Resp 14 04/24/23 1929  SpO2 94 % 04/24/23 1928  Vitals shown include unfiled device data.  Last Pain:  Vitals:   04/24/23 1347  TempSrc:   PainSc: 10-Worst pain ever         Complications: No notable events documented.

## 2023-04-24 NOTE — Anesthesia Procedure Notes (Addendum)
 Procedure Name: Intubation Date/Time: 04/24/2023 4:44 PM  Performed by: Randon Goldsmith, CRNAPre-anesthesia Checklist: Patient identified, Emergency Drugs available, Suction available and Patient being monitored Patient Re-evaluated:Patient Re-evaluated prior to induction Oxygen Delivery Method: Circle system utilized Preoxygenation: Pre-oxygenation with 100% oxygen Induction Type: IV induction Ventilation: Mask ventilation without difficulty Laryngoscope Size: Glidescope and 4 Grade View: Grade I Nasal Tubes: Nasal prep performed and Nasal Rae Tube size: 7.0 mm Number of attempts: 1 Airway Equipment and Method: Video-laryngoscopy Placement Confirmation: ETT inserted through vocal cords under direct vision, positive ETCO2 and breath sounds checked- equal and bilateral Tube secured with: Tape Dental Injury: Teeth and Oropharynx as per pre-operative assessment  Comments: Patient with mandible fracture and nasal fracture; afrin applied to both nares prior to rolling to OR. Dr. Ross Marcus states left nare would likely be the easiest. Howze, MD utilized red rubber technique to guide nasal ETT through nare. L nare attempted first without success, resistance met. Right nare met some resistance but able to pass ETT. Once ETT visualized in the back of throat with glidescope, pilot balloon inflated to facilitate direction to vocal cords; once above vocal cords, pilot balloon deflated and ETT passed through vocal cords.
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
## 2023-04-24 NOTE — Plan of Care (Signed)
  Problem: Health Behavior/Discharge Planning: Goal: Ability to manage health-related needs will improve Outcome: Progressing   Problem: Clinical Measurements: Goal: Ability to maintain clinical measurements within normal limits will improve Outcome: Progressing   Problem: Safety: Goal: Ability to remain free from injury will improve Outcome: Progressing   

## 2023-04-24 NOTE — Progress Notes (Signed)
 Patient not seen as has been in OR most of the afternoon. I have tried to see patient x 2 and called PACU and it appears patient will nto be done with surgeyr until after 19:00  Will ask night coverage to look in on patient if prn  Pleas Koch, MD [facility] 5:58 PM

## 2023-04-24 NOTE — Progress Notes (Signed)
  Received page from daytime physician Dr. Mahala Menghini informing that patient is having nasal bleeding.  Went to see patient at PACU at 6:45 PM and during that time patient was still in the operating room.  Spoke with anesthesiology over phone informed that nasal packing has been placed and patient's nasal bleeding has been completely resolved. If patient continues to have nasal bleeding need to consult ENT to evaluate. If the patient still remains intubated postop that case need to transfer to ICU.  Tereasa Coop, MD [facility] 04/24/2023, 8:19 PM

## 2023-04-24 NOTE — H&P (Signed)
 Anesthesia H&P Update: History and Physical Exam reviewed; patient is OK for planned anesthetic and procedure. ? ?

## 2023-04-24 NOTE — Anesthesia Postprocedure Evaluation (Signed)
 Anesthesia Post Note  Patient: Gail Weber  Procedure(s) Performed: OPEN REDUCTION INTERNAL FIXATION (ORIF) MANDIBULAR FRACTURE DENTAL RESTORATION/EXTRACTIONS     Patient location during evaluation: PACU Anesthesia Type: General Level of consciousness: awake and alert Pain management: pain level controlled Vital Signs Assessment: post-procedure vital signs reviewed and stable Respiratory status: spontaneous breathing, nonlabored ventilation, respiratory function stable and patient connected to nasal cannula oxygen Cardiovascular status: blood pressure returned to baseline and stable Postop Assessment: no apparent nausea or vomiting Anesthetic complications: no  No notable events documented.  Last Vitals:  Vitals:   04/24/23 1945 04/24/23 2000  BP: (!) 149/81 (!) 142/81  Pulse: 90 88  Resp: (!) 21 (!) 29  Temp:    SpO2: 90% 93%    Last Pain:  Vitals:   04/24/23 1954  TempSrc:   PainSc: 6                  Shelton Silvas

## 2023-04-24 NOTE — Plan of Care (Signed)
  Problem: Education: Goal: Knowledge of General Education information will improve Description: Including pain rating scale, medication(s)/side effects and non-pharmacologic comfort measures Outcome: Progressing   Problem: Health Behavior/Discharge Planning: Goal: Ability to manage health-related needs will improve Outcome: Progressing   Problem: Activity: Goal: Risk for activity intolerance will decrease Outcome: Progressing   Problem: Elimination: Goal: Will not experience complications related to bowel motility Outcome: Progressing   Problem: Pain Managment: Goal: General experience of comfort will improve and/or be controlled Outcome: Progressing   Problem: Safety: Goal: Ability to remain free from injury will improve Outcome: Progressing   Problem: Skin Integrity: Goal: Risk for impaired skin integrity will decrease Outcome: Progressing

## 2023-04-24 NOTE — Progress Notes (Addendum)
 Postop patient is hemodynamically stable. Patient is complaining about significant pain. Continue pain management. Oral surgeon Dr. Ross Marcus recommended clear liquid diet for 6 weeks. As patient has nasal bleeding with has been resolved as of now holding pharmacological DVT prophylaxis.  Tereasa Coop, MD [facility] 04/24/2023, 9:46 PM

## 2023-04-25 ENCOUNTER — Other Ambulatory Visit: Payer: Self-pay

## 2023-04-25 ENCOUNTER — Inpatient Hospital Stay (HOSPITAL_COMMUNITY): Payer: MEDICAID

## 2023-04-25 DIAGNOSIS — F1910 Other psychoactive substance abuse, uncomplicated: Secondary | ICD-10-CM | POA: Diagnosis present

## 2023-04-25 DIAGNOSIS — J3489 Other specified disorders of nose and nasal sinuses: Secondary | ICD-10-CM | POA: Diagnosis present

## 2023-04-25 DIAGNOSIS — S02609A Fracture of mandible, unspecified, initial encounter for closed fracture: Secondary | ICD-10-CM | POA: Diagnosis present

## 2023-04-25 DIAGNOSIS — R040 Epistaxis: Secondary | ICD-10-CM | POA: Diagnosis present

## 2023-04-25 LAB — CBC WITH DIFFERENTIAL/PLATELET
Abs Immature Granulocytes: 0.04 10*3/uL (ref 0.00–0.07)
Basophils Absolute: 0 10*3/uL (ref 0.0–0.1)
Basophils Relative: 0 %
Eosinophils Absolute: 0 10*3/uL (ref 0.0–0.5)
Eosinophils Relative: 0 %
HCT: 31.1 % — ABNORMAL LOW (ref 36.0–46.0)
Hemoglobin: 9.8 g/dL — ABNORMAL LOW (ref 12.0–15.0)
Immature Granulocytes: 0 %
Lymphocytes Relative: 11 %
Lymphs Abs: 1.3 10*3/uL (ref 0.7–4.0)
MCH: 21.8 pg — ABNORMAL LOW (ref 26.0–34.0)
MCHC: 31.5 g/dL (ref 30.0–36.0)
MCV: 69.1 fL — ABNORMAL LOW (ref 80.0–100.0)
Monocytes Absolute: 1.2 10*3/uL — ABNORMAL HIGH (ref 0.1–1.0)
Monocytes Relative: 10 %
Neutro Abs: 9.4 10*3/uL — ABNORMAL HIGH (ref 1.7–7.7)
Neutrophils Relative %: 79 %
Platelets: 398 10*3/uL (ref 150–400)
RBC: 4.5 MIL/uL (ref 3.87–5.11)
RDW: 15.2 % (ref 11.5–15.5)
Smear Review: NORMAL
WBC: 12 10*3/uL — ABNORMAL HIGH (ref 4.0–10.5)
nRBC: 0 % (ref 0.0–0.2)

## 2023-04-25 LAB — BASIC METABOLIC PANEL WITH GFR
Anion gap: 12 (ref 5–15)
BUN: 9 mg/dL (ref 6–20)
CO2: 18 mmol/L — ABNORMAL LOW (ref 22–32)
Calcium: 8.2 mg/dL — ABNORMAL LOW (ref 8.9–10.3)
Chloride: 105 mmol/L (ref 98–111)
Creatinine, Ser: 0.72 mg/dL (ref 0.44–1.00)
GFR, Estimated: >60 mL/min (ref >60)
Glucose, Bld: 120 mg/dL — ABNORMAL HIGH (ref 70–99)
Potassium: 3.1 mmol/L — ABNORMAL LOW (ref 3.5–5.1)
Sodium: 135 mmol/L (ref 135–145)

## 2023-04-25 MED ORDER — FAMOTIDINE 40 MG/5ML PO SUSR
20.0000 mg | Freq: Every day | ORAL | Status: DC
Start: 2023-04-25 — End: 2023-04-25

## 2023-04-25 MED ORDER — ENSURE ENLIVE PO LIQD
237.0000 mL | Freq: Three times a day (TID) | ORAL | Status: DC
  Administered 2023-04-25 – 2023-04-26 (×3): 237 mL via ORAL

## 2023-04-25 MED ORDER — FAMOTIDINE 20 MG PO TABS
20.0000 mg | ORAL_TABLET | Freq: Every day | ORAL | Status: DC
  Administered 2023-04-25 – 2023-04-26 (×2): 20 mg via ORAL
  Filled 2023-04-25 (×2): qty 1

## 2023-04-25 MED ORDER — OXYCODONE HCL 5 MG PO TABS
10.0000 mg | ORAL_TABLET | Freq: Four times a day (QID) | ORAL | Status: DC | PRN
  Administered 2023-04-25 – 2023-04-26 (×3): 10 mg via ORAL
  Filled 2023-04-25 (×4): qty 2

## 2023-04-25 MED ORDER — BOOST / RESOURCE BREEZE PO LIQD CUSTOM
1.0000 | Freq: Three times a day (TID) | ORAL | Status: DC
  Administered 2023-04-25: 1 via ORAL

## 2023-04-25 MED ORDER — POLYETHYLENE GLYCOL 3350 17 G PO PACK
17.0000 g | PACK | Freq: Every day | ORAL | Status: DC
  Administered 2023-04-25 – 2023-04-26 (×2): 17 g via ORAL
  Filled 2023-04-25 (×2): qty 1

## 2023-04-25 MED ORDER — POTASSIUM CHLORIDE 20 MEQ PO PACK
40.0000 meq | PACK | Freq: Once | ORAL | Status: AC
  Administered 2023-04-25: 40 meq via ORAL
  Filled 2023-04-25: qty 2

## 2023-04-25 MED ORDER — KETOROLAC TROMETHAMINE 30 MG/ML IJ SOLN: 30.0000 mg | Freq: Three times a day (TID) | INTRAMUSCULAR | Status: DC

## 2023-04-25 MED ORDER — DEXAMETHASONE SODIUM PHOSPHATE 10 MG/ML IJ SOLN
10.0000 mg | Freq: Once | INTRAMUSCULAR | Status: AC
  Administered 2023-04-25: 10 mg via INTRAVENOUS
  Filled 2023-04-25: qty 1

## 2023-04-25 MED ORDER — KETOROLAC TROMETHAMINE 15 MG/ML IJ SOLN
15.0000 mg | Freq: Three times a day (TID) | INTRAMUSCULAR | Status: DC
  Administered 2023-04-25 – 2023-04-26 (×3): 15 mg via INTRAVENOUS
  Filled 2023-04-25 (×3): qty 1

## 2023-04-25 NOTE — Plan of Care (Signed)
  Problem: Pain Managment: Goal: General experience of comfort will improve and/or be controlled Outcome: Progressing   Problem: Safety: Goal: Ability to remain free from injury will improve Outcome: Progressing   Problem: Skin Integrity: Goal: Risk for impaired skin integrity will decrease Outcome: Progressing
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
## 2023-04-25 NOTE — Plan of Care (Signed)
 Patient educated concerning need to maintain oral intake post op
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
## 2023-04-26 ENCOUNTER — Encounter (HOSPITAL_COMMUNITY): Payer: Self-pay | Admitting: Oral Surgery

## 2023-04-26 ENCOUNTER — Other Ambulatory Visit (HOSPITAL_COMMUNITY): Payer: Self-pay

## 2023-04-26 LAB — CBC WITH DIFFERENTIAL/PLATELET
Abs Immature Granulocytes: 0 10*3/uL (ref 0.00–0.07)
Basophils Absolute: 0 10*3/uL (ref 0.0–0.1)
Basophils Relative: 0 %
Eosinophils Absolute: 0 10*3/uL (ref 0.0–0.5)
Eosinophils Relative: 0 %
HCT: 31.8 % — ABNORMAL LOW (ref 36.0–46.0)
Hemoglobin: 10.1 g/dL — ABNORMAL LOW (ref 12.0–15.0)
Lymphocytes Relative: 23 %
Lymphs Abs: 2.2 10*3/uL (ref 0.7–4.0)
MCH: 22.1 pg — ABNORMAL LOW (ref 26.0–34.0)
MCHC: 31.8 g/dL (ref 30.0–36.0)
MCV: 69.7 fL — ABNORMAL LOW (ref 80.0–100.0)
Monocytes Absolute: 0.7 10*3/uL (ref 0.1–1.0)
Monocytes Relative: 7 %
Neutro Abs: 6.7 10*3/uL (ref 1.7–7.7)
Neutrophils Relative %: 70 %
Platelets: 407 10*3/uL — ABNORMAL HIGH (ref 150–400)
RBC: 4.56 MIL/uL (ref 3.87–5.11)
RDW: 15.6 % — ABNORMAL HIGH (ref 11.5–15.5)
WBC: 9.5 10*3/uL (ref 4.0–10.5)
nRBC: 0 % (ref 0.0–0.2)
nRBC: 0 /100{WBCs}

## 2023-04-26 LAB — BASIC METABOLIC PANEL WITH GFR
Anion gap: 8 (ref 5–15)
BUN: 11 mg/dL (ref 6–20)
CO2: 22 mmol/L (ref 22–32)
Calcium: 8.4 mg/dL — ABNORMAL LOW (ref 8.9–10.3)
Chloride: 108 mmol/L (ref 98–111)
Creatinine, Ser: 0.81 mg/dL (ref 0.44–1.00)
GFR, Estimated: >60 mL/min (ref >60)
Glucose, Bld: 111 mg/dL — ABNORMAL HIGH (ref 70–99)
Potassium: 3.2 mmol/L — ABNORMAL LOW (ref 3.5–5.1)
Sodium: 138 mmol/L (ref 135–145)

## 2023-04-26 MED ORDER — ACETAMINOPHEN 500 MG PO TABS
1000.0000 mg | ORAL_TABLET | Freq: Three times a day (TID) | ORAL | 0 refills | Status: DC
  Filled 2023-04-26: qty 30, 5d supply, fill #0

## 2023-04-26 MED ORDER — CEPHALEXIN 500 MG PO CAPS: 500.0000 mg | ORAL_CAPSULE | Freq: Two times a day (BID) | ORAL | Status: AC

## 2023-04-26 MED ORDER — ENSURE ENLIVE PO LIQD
237.0000 mL | Freq: Three times a day (TID) | ORAL | 0 refills | Status: DC
  Filled 2023-04-26: qty 10000, 14d supply, fill #0

## 2023-04-26 MED ORDER — FAMOTIDINE 20 MG PO TABS
20.0000 mg | ORAL_TABLET | Freq: Every day | ORAL | 0 refills | Status: DC
  Filled 2023-04-26: qty 30, 30d supply, fill #0

## 2023-04-26 MED ORDER — POLYETHYLENE GLYCOL 3350 17 GM/SCOOP PO POWD
17.0000 g | Freq: Every day | ORAL | 0 refills | Status: DC
  Filled 2023-04-26: qty 238, 14d supply, fill #0

## 2023-04-26 MED ORDER — OXYCODONE HCL 10 MG PO TABS
10.0000 mg | ORAL_TABLET | Freq: Four times a day (QID) | ORAL | 0 refills | Status: DC | PRN
  Filled 2023-04-26: qty 20, 5d supply, fill #0

## 2023-04-26 MED ORDER — POTASSIUM CHLORIDE 20 MEQ PO PACK
60.0000 meq | PACK | Freq: Once | ORAL | Status: AC
  Administered 2023-04-26: 60 meq via ORAL
  Filled 2023-04-26: qty 3

## 2023-04-26 MED ORDER — IBUPROFEN 800 MG PO TABS
800.0000 mg | ORAL_TABLET | Freq: Three times a day (TID) | ORAL | 0 refills | Status: DC | PRN
  Filled 2023-04-26: qty 30, 10d supply, fill #0

## 2023-04-26 MED ORDER — KETOROLAC TROMETHAMINE 10 MG PO TABS
10.0000 mg | ORAL_TABLET | Freq: Three times a day (TID) | ORAL | 0 refills | Status: DC
  Filled 2023-04-26: qty 20, 5d supply, fill #0
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
## 2023-04-26 NOTE — Progress Notes (Signed)
 [facility] to [facility] [phone]) spoke with nurse Joni Reining who will be taking care of patient once she arrives. Joni Reining verbalized understanding of report and had no further questions.

## 2023-04-26 NOTE — Plan of Care (Signed)
  Problem: Education: Goal: Knowledge of General Education information will improve Description: Including pain rating scale, medication(s)/side effects and non-pharmacologic comfort measures 04/26/2023 0430 by Hassell Halim, RN Outcome: Progressing 04/26/2023 0430 by Hassell Halim, RN Outcome: Progressing   Problem: Clinical Measurements: Goal: Will remain free from infection 04/26/2023 0430 by Maresha Anastos C, RN Outcome: Progressing 04/26/2023 0430 by Shearon Balo C, RN Outcome: Progressing   Problem: Coping: Goal: Level of anxiety will decrease 04/26/2023 0430 by Damonie Ellenwood C, RN Outcome: Progressing 04/26/2023 0430 by Shearon Balo C, RN Outcome: Progressing   Problem: Pain Managment: Goal: General experience of comfort will improve and/or be controlled 04/26/2023 0430 by Daimon Kean C, RN Outcome: Progressing 04/26/2023 0430 by Hassell Halim, RN Outcome: Progressing   Problem: Safety: Goal: Ability to remain free from injury will improve 04/26/2023 0430 by Manhattan Mccuen C, RN Outcome: Progressing 04/26/2023 0430 by Hassell Halim, RN Outcome: Progressing
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
## 2023-04-26 NOTE — TOC CM/SW Note (Addendum)
 Anticipated discharge today. NCM called [facility] Nurses station [phone]  spoke to [place], she will have nurse call NCM back   Joni Reining the Interior and spatial designer of Nursing at [facility] called NCM back. She is requesting H and P, op report and lastest progress notes be faxed to her at [phone]. Same faxed. Once discharge summary she will need that faxed also . She is requesting medications and any needed follow up be listed on the discharge summary .   Discharge summary faxed to [facility]   Provided nurse with number to call report

## 2023-05-30 ENCOUNTER — Inpatient Hospital Stay (HOSPITAL_COMMUNITY)
Admission: EM | Admit: 2023-05-30 | Discharge: 2023-06-01 | DRG: 157 | Discharge status: Against Medical Advice | Attending: Internal Medicine | Admitting: Internal Medicine

## 2023-05-30 ENCOUNTER — Encounter (HOSPITAL_COMMUNITY): Payer: Self-pay | Admitting: *Deleted

## 2023-05-30 ENCOUNTER — Other Ambulatory Visit: Payer: Self-pay

## 2023-05-30 DIAGNOSIS — Z5329 Procedure and treatment not carried out because of patient's decision for other reasons: Secondary | ICD-10-CM | POA: Diagnosis not present

## 2023-05-30 DIAGNOSIS — J45909 Unspecified asthma, uncomplicated: Secondary | ICD-10-CM | POA: Diagnosis present

## 2023-05-30 DIAGNOSIS — G928 Other toxic encephalopathy: Secondary | ICD-10-CM | POA: Diagnosis present

## 2023-05-30 DIAGNOSIS — F17210 Nicotine dependence, cigarettes, uncomplicated: Secondary | ICD-10-CM | POA: Diagnosis present

## 2023-05-30 DIAGNOSIS — T8140XA Infection following a procedure, unspecified, initial encounter: Secondary | ICD-10-CM | POA: Diagnosis present

## 2023-05-30 DIAGNOSIS — D509 Iron deficiency anemia, unspecified: Secondary | ICD-10-CM | POA: Diagnosis present

## 2023-05-30 DIAGNOSIS — Z79899 Other long term (current) drug therapy: Secondary | ICD-10-CM

## 2023-05-30 DIAGNOSIS — L03211 Cellulitis of face: Secondary | ICD-10-CM | POA: Diagnosis present

## 2023-05-30 DIAGNOSIS — Z6835 Body mass index (BMI) 35.0-35.9, adult: Secondary | ICD-10-CM

## 2023-05-30 DIAGNOSIS — M272 Inflammatory conditions of jaws: Principal | ICD-10-CM | POA: Diagnosis present

## 2023-05-30 DIAGNOSIS — E66812 Obesity, class 2: Secondary | ICD-10-CM | POA: Diagnosis present

## 2023-05-30 DIAGNOSIS — Y838 Other surgical procedures as the cause of abnormal reaction of the patient, or of later complication, without mention of misadventure at the time of the procedure: Secondary | ICD-10-CM | POA: Diagnosis present

## 2023-05-30 DIAGNOSIS — S02609A Fracture of mandible, unspecified, initial encounter for closed fracture: Secondary | ICD-10-CM | POA: Diagnosis present

## 2023-05-30 DIAGNOSIS — Z91040 Latex allergy status: Secondary | ICD-10-CM

## 2023-05-30 DIAGNOSIS — Z5900 Homelessness unspecified: Secondary | ICD-10-CM
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
## 2023-05-30 NOTE — ED Triage Notes (Signed)
 The rt side of the patients face is visibly swollen

## 2023-05-31 ENCOUNTER — Inpatient Hospital Stay (HOSPITAL_COMMUNITY)

## 2023-05-31 ENCOUNTER — Emergency Department (HOSPITAL_COMMUNITY)

## 2023-05-31 DIAGNOSIS — J45909 Unspecified asthma, uncomplicated: Secondary | ICD-10-CM | POA: Diagnosis present

## 2023-05-31 DIAGNOSIS — G9340 Encephalopathy, unspecified: Secondary | ICD-10-CM

## 2023-05-31 DIAGNOSIS — Z91040 Latex allergy status: Secondary | ICD-10-CM | POA: Diagnosis not present

## 2023-05-31 DIAGNOSIS — M272 Inflammatory conditions of jaws: Secondary | ICD-10-CM | POA: Diagnosis present

## 2023-05-31 DIAGNOSIS — Y838 Other surgical procedures as the cause of abnormal reaction of the patient, or of later complication, without mention of misadventure at the time of the procedure: Secondary | ICD-10-CM | POA: Diagnosis present

## 2023-05-31 DIAGNOSIS — Z5900 Homelessness unspecified: Secondary | ICD-10-CM | POA: Diagnosis not present

## 2023-05-31 DIAGNOSIS — R6884 Jaw pain: Secondary | ICD-10-CM | POA: Diagnosis present

## 2023-05-31 DIAGNOSIS — T8140XA Infection following a procedure, unspecified, initial encounter: Secondary | ICD-10-CM | POA: Diagnosis present

## 2023-05-31 DIAGNOSIS — D649 Anemia, unspecified: Secondary | ICD-10-CM | POA: Diagnosis not present

## 2023-05-31 DIAGNOSIS — E66812 Obesity, class 2: Secondary | ICD-10-CM | POA: Diagnosis present

## 2023-05-31 DIAGNOSIS — D509 Iron deficiency anemia, unspecified: Secondary | ICD-10-CM | POA: Diagnosis present

## 2023-05-31 DIAGNOSIS — Z6835 Body mass index (BMI) 35.0-35.9, adult: Secondary | ICD-10-CM | POA: Diagnosis not present

## 2023-05-31 DIAGNOSIS — Z79899 Other long term (current) drug therapy: Secondary | ICD-10-CM | POA: Diagnosis not present

## 2023-05-31 DIAGNOSIS — S02609A Fracture of mandible, unspecified, initial encounter for closed fracture: Secondary | ICD-10-CM | POA: Diagnosis present

## 2023-05-31 DIAGNOSIS — F17210 Nicotine dependence, cigarettes, uncomplicated: Secondary | ICD-10-CM | POA: Diagnosis present

## 2023-05-31 DIAGNOSIS — L03211 Cellulitis of face: Secondary | ICD-10-CM | POA: Diagnosis present

## 2023-05-31 DIAGNOSIS — G928 Other toxic encephalopathy: Secondary | ICD-10-CM | POA: Diagnosis present

## 2023-05-31 DIAGNOSIS — Z5329 Procedure and treatment not carried out because of patient's decision for other reasons: Secondary | ICD-10-CM | POA: Diagnosis not present

## 2023-05-31 LAB — CBC
HCT: 21.8 % — ABNORMAL LOW (ref 36.0–46.0)
Hemoglobin: 6.7 g/dL — CL (ref 12.0–15.0)
MCH: 24.9 pg — ABNORMAL LOW (ref 26.0–34.0)
MCHC: 30.7 g/dL (ref 30.0–36.0)
MCV: 81 fL (ref 80.0–100.0)
Platelets: 358 10*3/uL (ref 150–400)
RBC: 2.69 MIL/uL — ABNORMAL LOW (ref 3.87–5.11)
RDW: 18.9 % — ABNORMAL HIGH (ref 11.5–15.5)
WBC: 5.4 10*3/uL (ref 4.0–10.5)
nRBC: 0.6 % — ABNORMAL HIGH (ref 0.0–0.2)

## 2023-05-31 LAB — CBC WITH DIFFERENTIAL/PLATELET
Abs Immature Granulocytes: 0.08 10*3/uL — ABNORMAL HIGH (ref 0.00–0.07)
Abs Immature Granulocytes: 0.06 10*3/uL (ref 0.00–0.07)
Basophils Absolute: 0 10*3/uL (ref 0.0–0.1)
Basophils Absolute: 0 10*3/uL (ref 0.0–0.1)
Basophils Relative: 0 %
Basophils Relative: 0 %
Eosinophils Absolute: 0.1 10*3/uL (ref 0.0–0.5)
Eosinophils Absolute: 0.1 10*3/uL (ref 0.0–0.5)
Eosinophils Relative: 1 %
Eosinophils Relative: 2 %
HCT: 19.8 % — ABNORMAL LOW (ref 36.0–46.0)
HCT: 19.4 % — ABNORMAL LOW (ref 36.0–46.0)
Hemoglobin: 5.9 g/dL — CL (ref 12.0–15.0)
Hemoglobin: 5.8 g/dL — CL (ref 12.0–15.0)
Immature Granulocytes: 1 %
Immature Granulocytes: 1 %
Lymphocytes Relative: 30 %
Lymphocytes Relative: 33 %
Lymphs Abs: 2.1 10*3/uL (ref 0.7–4.0)
Lymphs Abs: 2.1 10*3/uL (ref 0.7–4.0)
MCH: 24.1 pg — ABNORMAL LOW (ref 26.0–34.0)
MCH: 24.2 pg — ABNORMAL LOW (ref 26.0–34.0)
MCHC: 29.8 g/dL — ABNORMAL LOW (ref 30.0–36.0)
MCHC: 29.9 g/dL — ABNORMAL LOW (ref 30.0–36.0)
MCV: 80.8 fL (ref 80.0–100.0)
MCV: 80.8 fL (ref 80.0–100.0)
Monocytes Absolute: 0.8 10*3/uL (ref 0.1–1.0)
Monocytes Absolute: 0.7 10*3/uL (ref 0.1–1.0)
Monocytes Relative: 11 %
Monocytes Relative: 11 %
Neutro Abs: 3.9 10*3/uL (ref 1.7–7.7)
Neutro Abs: 3.3 10*3/uL (ref 1.7–7.7)
Neutrophils Relative %: 57 %
Neutrophils Relative %: 53 %
Platelets: 342 10*3/uL (ref 150–400)
Platelets: 339 10*3/uL (ref 150–400)
RBC: 2.45 MIL/uL — ABNORMAL LOW (ref 3.87–5.11)
RBC: 2.4 MIL/uL — ABNORMAL LOW (ref 3.87–5.11)
RDW: 18.7 % — ABNORMAL HIGH (ref 11.5–15.5)
RDW: 18.8 % — ABNORMAL HIGH (ref 11.5–15.5)
WBC: 6.9 10*3/uL (ref 4.0–10.5)
WBC: 6.3 10*3/uL (ref 4.0–10.5)
nRBC: 0.6 % — ABNORMAL HIGH (ref 0.0–0.2)
nRBC: 0.5 % — ABNORMAL HIGH (ref 0.0–0.2)

## 2023-05-31 LAB — BASIC METABOLIC PANEL WITH GFR
Anion gap: 8 (ref 5–15)
BUN: 13 mg/dL (ref 6–20)
CO2: 21 mmol/L — ABNORMAL LOW (ref 22–32)
Calcium: 8.2 mg/dL — ABNORMAL LOW (ref 8.9–10.3)
Chloride: 110 mmol/L (ref 98–111)
Creatinine, Ser: 0.92 mg/dL (ref 0.44–1.00)
GFR, Estimated: >60 mL/min (ref >60)
Glucose, Bld: 93 mg/dL (ref 70–99)
Potassium: 3 mmol/L — ABNORMAL LOW (ref 3.5–5.1)
Sodium: 139 mmol/L (ref 135–145)

## 2023-05-31 LAB — I-STAT CHEM 8, ED
BUN: 14 mg/dL (ref 6–20)
Calcium, Ion: 1.15 mmol/L (ref 1.15–1.40)
Chloride: 109 mmol/L (ref 98–111)
Creatinine, Ser: 1 mg/dL (ref 0.44–1.00)
Glucose, Bld: 90 mg/dL (ref 70–99)
HCT: 21 % — ABNORMAL LOW (ref 36.0–46.0)
Hemoglobin: 7.1 g/dL — ABNORMAL LOW (ref 12.0–15.0)
Potassium: 3.1 mmol/L — ABNORMAL LOW (ref 3.5–5.1)
Sodium: 141 mmol/L (ref 135–145)
TCO2: 20 mmol/L — ABNORMAL LOW (ref 22–32)

## 2023-05-31 LAB — RAPID URINE DRUG SCREEN, HOSP PERFORMED
Amphetamines: NOT DETECTED
Barbiturates: NOT DETECTED
Benzodiazepines: NOT DETECTED
Cocaine: POSITIVE — AB
Opiates: NOT DETECTED
Tetrahydrocannabinol: NOT DETECTED

## 2023-05-31 LAB — PREGNANCY, URINE: Preg Test, Ur: NEGATIVE

## 2023-05-31 LAB — POC OCCULT BLOOD, ED: Fecal Occult Bld: NEGATIVE

## 2023-05-31 LAB — PREPARE RBC (CROSSMATCH)

## 2023-05-31 LAB — HCG, SERUM, QUALITATIVE: Preg, Serum: NEGATIVE

## 2023-05-31 MED ORDER — SODIUM CHLORIDE 0.9% FLUSH
3.0000 mL | Freq: Two times a day (BID) | INTRAVENOUS | Status: DC
  Administered 2023-05-31 – 2023-06-01 (×3): 3 mL via INTRAVENOUS

## 2023-05-31 MED ORDER — CLINDAMYCIN PHOSPHATE 600 MG/50ML IV SOLN: 600.0000 mg | Freq: Once | INTRAVENOUS | Status: DC

## 2023-05-31 MED ORDER — VANCOMYCIN HCL IN DEXTROSE 1-5 GM/200ML-% IV SOLN: 1000.0000 mg | Freq: Once | INTRAVENOUS | Status: DC

## 2023-05-31 MED ORDER — ACETAMINOPHEN 500 MG PO TABS
1000.0000 mg | ORAL_TABLET | Freq: Four times a day (QID) | ORAL | Status: DC | PRN
  Filled 2023-05-31: qty 2

## 2023-05-31 MED ORDER — MELATONIN 3 MG PO TABS: 6.0000 mg | ORAL_TABLET | Freq: Every evening | ORAL | Status: DC | PRN

## 2023-05-31 MED ORDER — ENOXAPARIN SODIUM 40 MG/0.4ML IJ SOSY: 40.0000 mg | PREFILLED_SYRINGE | INTRAMUSCULAR | Status: DC

## 2023-05-31 MED ORDER — POLYETHYLENE GLYCOL 3350 17 G PO PACK: 17.0000 g | PACK | Freq: Every day | ORAL | Status: DC | PRN

## 2023-05-31 MED ORDER — SODIUM CHLORIDE 0.9% IV SOLUTION: Freq: Once | INTRAVENOUS | Status: DC

## 2023-05-31 MED ORDER — FERROUS SULFATE 325 (65 FE) MG PO TABS
325.0000 mg | ORAL_TABLET | ORAL | Status: DC
  Filled 2023-05-31: qty 1

## 2023-05-31 MED ORDER — SODIUM CHLORIDE 0.9 % IV SOLN
3.0000 g | Freq: Four times a day (QID) | INTRAVENOUS | Status: DC
  Administered 2023-05-31 – 2023-06-01 (×5): 3 g via INTRAVENOUS
  Filled 2023-05-31 (×5): qty 8

## 2023-05-31 MED ORDER — ONDANSETRON HCL 4 MG/2ML IJ SOLN: 4.0000 mg | Freq: Four times a day (QID) | INTRAMUSCULAR | Status: DC | PRN

## 2023-05-31 MED ORDER — IOHEXOL 350 MG/ML SOLN
75.0000 mL | Freq: Once | INTRAVENOUS | Status: AC | PRN
  Administered 2023-05-31: 75 mL via INTRAVENOUS

## 2023-05-31 MED ORDER — NALOXONE HCL 0.4 MG/ML IJ SOLN
0.2000 mg | Freq: Once | INTRAMUSCULAR | Status: AC
  Administered 2023-05-31: 0.2 mg via INTRAVENOUS
  Filled 2023-05-31: qty 1

## 2023-05-31 MED ORDER — POTASSIUM CHLORIDE CRYS ER 20 MEQ PO TBCR
40.0000 meq | EXTENDED_RELEASE_TABLET | Freq: Once | ORAL | Status: DC
  Filled 2023-05-31: qty 2

## 2023-05-31 MED ORDER — ALBUTEROL SULFATE (2.5 MG/3ML) 0.083% IN NEBU: 2.5000 mg | INHALATION_SOLUTION | RESPIRATORY_TRACT | Status: DC | PRN

## 2023-05-31 NOTE — Plan of Care (Signed)
  Problem: Activity: Goal: Risk for activity intolerance will decrease Outcome: Progressing   Problem: Nutrition: Goal: Adequate nutrition will be maintained Outcome: Progressing   Problem: Safety: Goal: Ability to remain free from injury will improve Outcome: Progressing   Problem: Education: Goal: Knowledge of General Education information will improve Description: Including pain rating scale, medication(s)/side effects and non-pharmacologic comfort measures Outcome: Not Progressing

## 2023-05-31 NOTE — Progress Notes (Signed)
 Patient has order for 1 unit of PRBC, continues to refuse transfusion or signing refusal paper. Educated on safety risk associated with blood transfusion. Stated " I will get it later not now!" Notified on call provider. No new orders
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
## 2023-05-31 NOTE — ED Provider Notes (Signed)
 [facility] AT [facility] Provider Note   CSN: [id number] Arrival date & time: 05/30/23  2311     History  Chief Complaint  Patient presents with   Jaw Pain    Gail Weber is a 38 y.o. female with PMHx asthma, anemia, mandibular fracture s/p open reduction and internal fixation by Dr. Serge Dancer 04/24/2023 who presents to ED concerned for right jaw swelling. No recent jaw injury. Patient stating that the swelling started 5 days ago. Endorses fever yesterday which has since resolved.   Patient very sleepy during interview. When asking patient why she is so sleepy, patient stating that she is homeless and has not had good sleep recently. Patient was ambulatory with steady gait and awake in triage per triage RN.  HPI     Home Medications Prior to Admission medications   Medication Sig Start Date End Date Taking? Authorizing Provider  acetaminophen  (TYLENOL ) 500 MG tablet Take 2 tablets (1,000 mg total) by mouth every 8 (eight) hours. 04/26/23   Kraig Peru, MD  famotidine  (PEPCID ) 20 MG tablet Take 1 tablet (20 mg total) by mouth daily. 04/27/23   Kraig Peru, MD  feeding supplement (ENSURE ENLIVE / ENSURE PLUS) LIQD Take 237 mLs by mouth 3 (three) times daily between meals. 04/26/23   Kraig Peru, MD  ibuprofen  (ADVIL ) 800 MG tablet Starting 4/9 (when ketorolac  is finished), take 1 tablet (800 mg total) by mouth every 8 (eight) hours as needed for mild pain (pain score 1-3) or moderate pain (pain score 4-6). 05/02/23   Kraig Peru, MD  ketorolac  (TORADOL ) 10 MG tablet Take 1 tablet (10 mg total) by mouth 4 (four) times daily -  with meals and at bedtime. 04/26/23   [name], [name], MD  Oxycodone  HCl 10 MG TABS Take 1 tablet (10 mg total) by mouth every 6 (six) hours as needed for severe pain (pain score 7-10). 04/26/23   Kraig Peru, MD  polyethylene glycol powder (GLYCOLAX /MIRALAX ) 17 GM/SCOOP powder Dissolve 1 capful (17 g) in water and take by  mouth daily. 04/27/23   Kraig Peru, MD      Allergies    Latex    Review of Systems   Review of Systems  HENT:         Jaw swelling    Physical Exam Updated Vital Signs BP 110/78   Pulse 62   Temp 97.6 F (36.4 C) (Axillary)   Resp 19   Ht 5\' 7"  (1.702 m)   Wt 104 kg   LMP 05/30/2023   SpO2 100%   BMI 35.91 kg/m  Physical Exam Vitals and nursing note reviewed.  Constitutional:      General: She is not in acute distress.    Appearance: She is not ill-appearing or toxic-appearing.  HENT:     Head: Normocephalic and atraumatic.     Mouth/Throat:     Mouth: Mucous membranes are moist.     Comments: Moderate swelling of right lower jaw. Some increased warmth as well.  Eyes:     General: No scleral icterus.       Right eye: No discharge.        Left eye: No discharge.     Conjunctiva/sclera: Conjunctivae normal.  Cardiovascular:     Rate and Rhythm: Normal rate.     Pulses: Normal pulses.     Heart sounds: Normal heart sounds. No murmur heard. Pulmonary:     Effort: Pulmonary effort  is normal. No respiratory distress.     Breath sounds: Normal breath sounds. No wheezing, rhonchi or rales.  Musculoskeletal:     Right lower leg: No edema.     Left lower leg: No edema.  Skin:    General: Skin is warm and dry.     Findings: No rash.  Neurological:     General: No focal deficit present.     Mental Status: She is alert and oriented to person, place, and time. Mental status is at baseline.  Psychiatric:        Mood and Affect: Mood normal.     ED Results / Procedures / Treatments   Labs (all labs ordered are listed, but only abnormal results are displayed) Labs Reviewed  CBC WITH DIFFERENTIAL/PLATELET - Abnormal; Notable for the following components:      Result Value   RBC 2.45 (*)    Hemoglobin 5.9 (*)    HCT 19.8 (*)    MCH 24.1 (*)    MCHC 29.8 (*)    RDW 18.7 (*)    nRBC 0.6 (*)    Abs Immature Granulocytes 0.08 (*)    All other components within  normal limits  BASIC METABOLIC PANEL WITH GFR - Abnormal; Notable for the following components:   Potassium 3.0 (*)    CO2 21 (*)    Calcium 8.2 (*)    All other components within normal limits  CBC WITH DIFFERENTIAL/PLATELET - Abnormal; Notable for the following components:   RBC 2.40 (*)    Hemoglobin 5.8 (*)    HCT 19.4 (*)    MCH 24.2 (*)    MCHC 29.9 (*)    RDW 18.8 (*)    nRBC 0.5 (*)    All other components within normal limits  I-STAT CHEM 8, ED - Abnormal; Notable for the following components:   Potassium 3.1 (*)    TCO2 20 (*)    Hemoglobin 7.1 (*)    HCT 21.0 (*)    All other components within normal limits  HCG, SERUM, QUALITATIVE  PREGNANCY, URINE  RAPID URINE DRUG SCREEN, HOSP PERFORMED  POC OCCULT BLOOD, ED  TYPE AND SCREEN  PREPARE RBC (CROSSMATCH)    EKG None  Radiology CT Soft Tissue Neck W Contrast Result Date: 05/31/2023 CLINICAL DATA:  Soft tissue infection suspected, neck, xray done. EXAM: CT NECK WITH CONTRAST TECHNIQUE: Multidetector CT imaging of the neck was performed using the standard protocol following the bolus administration of intravenous contrast. RADIATION DOSE REDUCTION: This exam was performed according to the departmental dose-optimization program which includes automated exposure control, adjustment of the mA and/or kV according to patient size and/or use of iterative reconstruction technique. CONTRAST:  75mL OMNIPAQUE  IOHEXOL  350 MG/ML SOLN COMPARISON:  CT neck April 23, 2023. FINDINGS: Pharynx and larynx: Edema surrounding the right mandible is detailed below and extends to involve the right parapharyngeal space. Otherwise, within normal limits Salivary glands: No inflammation, mass, or stone. Thyroid: Normal. Lymph nodes: None enlarged or abnormal density. Vascular: Not well assessed due to non arterial timing. Limited intracranial: Negative. Visualized orbits: Negative. Mastoids and visualized paranasal sinuses: Mild inferior right maxillary  sinus mucosal thickening. Remaining sinuses are clear. No mastoid effusions. Skeleton: Interval plate and screw fixation of the previously seen right mandibular body fracture. It the fracture is not healed and there is also multiple new lucencies in this region suggestive of new/interval comminuted fractures. The inferior-most screw and plate is partially backed out. Alignment is improved near  anatomic. TMJ is located. Significant edema surrounding the mandible in this region without discrete drainable fluid collection. Upper chest: Small ground-glass opacities in the anterior left upper lobe. IMPRESSION: 1. Interval plate and screw fixation of the previously seen right mandibular body fracture. The fracture is not healed and there are multiple new surrounding lucencies suggestive of new/interval comminuted fractures. The inferior-most screw and plate is partially backed out. Alignment is improved and near anatomic. 2. Significant edema surrounding the right mandible, suspicious for infection although an element of postoperative change or contusion is possible. Recommend clinical correlation with signs of infection. No discrete, drainable fluid collection. 3. Small ground-glass opacities in the anterior left upper lobe which could represent contusions or infectious/inflammatory process. Electronically Signed   By: Stevenson Elbe M.D.   On: 05/31/2023 03:05    Procedures .Critical Care  Performed by: [place] Bureau, PA-C Authorized by: [place] Bureau, PA-C   Critical care provider statement:    Critical care time (minutes):  30   Critical care was necessary to treat or prevent imminent or life-threatening deterioration of the following conditions: mandibular infection and critical anemia.   Critical care was time spent personally by me on the following activities:  Development of treatment plan with patient or surrogate, discussions with consultants, evaluation of patient's response to  treatment, examination of patient, ordering and review of laboratory studies, ordering and review of radiographic studies, ordering and performing treatments and interventions, pulse oximetry, re-evaluation of patient's condition and review of old charts   Care discussed with: admitting provider       Medications Ordered in ED Medications  0.9 %  sodium chloride  infusion (Manually program via Guardrails IV Fluids) (has no administration in time range)  Ampicillin -Sulbactam (UNASYN ) 3 g in sodium chloride  0.9 % 100 mL IVPB (has no administration in time range)  enoxaparin  (LOVENOX ) injection 40 mg (has no administration in time range)  sodium chloride  flush (NS) 0.9 % injection 3 mL (has no administration in time range)  ferrous sulfate  tablet 325 mg (has no administration in time range)  acetaminophen  (TYLENOL ) tablet 1,000 mg (has no administration in time range)  melatonin tablet 6 mg (has no administration in time range)  ondansetron  (ZOFRAN ) injection 4 mg (has no administration in time range)  polyethylene glycol (MIRALAX  / GLYCOLAX ) packet 17 g (has no administration in time range)  albuterol  (PROVENTIL ) (2.5 MG/3ML) 0.083% nebulizer solution 2.5 mg (has no administration in time range)  potassium chloride  SA (KLOR-CON  M) CR tablet 40 mEq (has no administration in time range)  naloxone  (NARCAN ) injection 0.2 mg (has no administration in time range)  iohexol  (OMNIPAQUE ) 350 MG/ML injection 75 mL (75 mLs Intravenous Contrast Given 05/31/23 0243)    ED Course/ Medical Decision Making/ A&P                                 Medical Decision Making Amount and/or Complexity of Data Reviewed Labs: ordered. Radiology: ordered.  Risk Prescription drug management.    This patient presents to the ED for concern of jaw swelling, this involves an extensive number of treatment options, and is a complaint that carries with it a high risk of complications and morbidity.  The differential  diagnosis includes cellulitis, abscess, sepsis, folliculitis, necrotizing fasciitis, impetigo, osteomyelitis   Co morbidities that complicate the patient evaluation  asthma, anemia   Additional history obtained:  mandibular fracture s/p open reduction and internal  fixation by Dr. Serge Dancer 04/24/2023    Lab Tests:  I Ordered, and personally interpreted labs.  The pertinent results include:   -BMP: hypokalemia at 3.0 -CBC: critical anemia with hgb 5.8; no leukocytosis -hcg: negative -POC occult: negative   Problem List / ED Course / Critical interventions / Medication management  Patient presents to ED concern for right sided mandibular swelling x5 days.  Endorses fever yesterday which has since resolved.  Physical exam with moderate swelling of the right mandible.  Rest of physical exam reassuring.  Patient afebrile with stable vitals. Lab workup concerning for critical anemia - blood transfusion started.  I ordered imaging studies including CT soft tissue . I independently visualized and interpreted imaging which showed possible new comminuted fractures of mandible and significant edema surrounding her right mandible suspicious for infection. I agree with the radiologist interpretation I requested consultation with the ENT provider on-call Dr. Maryland Snow,  and discussed lab and imaging findings as well as pertinent plan - they recommend: IV ABX and agrees to see patient in the morning. I consulted with Dr. Amy Kansky who agrees to admit patient.  I have reviewed the patients home medicines and have made adjustments as needed.   Social Determinants of Health:  homeless         Final Clinical Impression(s) / ED Diagnoses Final diagnoses:  Infection of mandible    Rx / DC Orders ED Discharge Orders     None         Barry Bureau, [place] 05/31/23 0615    Ballard Bongo, MD 06/01/23 [phone]

## 2023-05-31 NOTE — TOC CM/SW Note (Addendum)
 NCM went to see patient regarding homeless and substance use. However, patient cannot keep her eyes open or hold a conversation  at present . Left resources at bedside and added some resources to AVS   Left at bedside: food pantries, shelter list, transportation resources , housing resources , [employer] , Substance use resources

## 2023-05-31 NOTE — Progress Notes (Signed)
 Patient refusing blood transfusion at this time. Educated patient on the safety risks associated with not having the transfusion. Patient refused to sign the blood product refusal form stating she will do the transfusion later. Notified Masiku Celedonio Coil, MD.
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
## 2023-06-01 ENCOUNTER — Encounter (HOSPITAL_COMMUNITY): Payer: Self-pay | Admitting: Anesthesiology

## 2023-06-01 LAB — FOLATE: Folate: 13.2 ng/mL (ref >5.9)

## 2023-06-01 LAB — CBC
HCT: 23.3 % — ABNORMAL LOW (ref 36.0–46.0)
Hemoglobin: 7.1 g/dL — ABNORMAL LOW (ref 12.0–15.0)
MCH: 24.7 pg — ABNORMAL LOW (ref 26.0–34.0)
MCHC: 30.5 g/dL (ref 30.0–36.0)
MCV: 80.9 fL (ref 80.0–100.0)
Platelets: 383 10*3/uL (ref 150–400)
RBC: 2.88 MIL/uL — ABNORMAL LOW (ref 3.87–5.11)
RDW: 19.7 % — ABNORMAL HIGH (ref 11.5–15.5)
WBC: 4.9 10*3/uL (ref 4.0–10.5)
nRBC: 0.4 % — ABNORMAL HIGH (ref 0.0–0.2)

## 2023-06-01 LAB — BASIC METABOLIC PANEL WITH GFR
Anion gap: 9 (ref 5–15)
BUN: 10 mg/dL (ref 6–20)
CO2: 22 mmol/L (ref 22–32)
Calcium: 8.2 mg/dL — ABNORMAL LOW (ref 8.9–10.3)
Chloride: 111 mmol/L (ref 98–111)
Creatinine, Ser: 0.78 mg/dL (ref 0.44–1.00)
GFR, Estimated: >60 mL/min (ref >60)
Glucose, Bld: 84 mg/dL (ref 70–99)
Potassium: 3 mmol/L — ABNORMAL LOW (ref 3.5–5.1)
Sodium: 142 mmol/L (ref 135–145)

## 2023-06-01 LAB — MAGNESIUM: Magnesium: 2.1 mg/dL (ref 1.7–2.4)

## 2023-06-01 LAB — SEDIMENTATION RATE: Sed Rate: 34 mm/h — ABNORMAL HIGH (ref 0–22)

## 2023-06-01 LAB — PHOSPHORUS: Phosphorus: 3.7 mg/dL (ref 2.5–4.6)

## 2023-06-01 LAB — VITAMIN B12: Vitamin B-12: 473 pg/mL (ref 180–914)

## 2023-06-01 LAB — C-REACTIVE PROTEIN: CRP: 1.7 mg/dL — ABNORMAL HIGH (ref <1.0)

## 2023-06-01 MED ORDER — POTASSIUM CHLORIDE 20 MEQ PO PACK
40.0000 meq | PACK | Freq: Once | ORAL | Status: AC
  Administered 2023-06-01: 40 meq via ORAL
  Filled 2023-06-01: qty 2

## 2023-06-01 MED ORDER — POTASSIUM CHLORIDE 20 MEQ PO PACK
40.0000 meq | PACK | Freq: Once | ORAL | Status: DC
  Filled 2023-06-01: qty 2

## 2023-06-01 NOTE — Progress Notes (Signed)
 Pt approached the desk and mentioned that she was leaving against medical advice. Would not listen to any kind of advice regarding staying for possible surgery. Initially signed the [facility] paperwork then ripped the document when she read that she could refuse to sign the documentation. IV removed, MD and primary nurse notified.
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
## 2023-06-01 NOTE — Plan of Care (Signed)
  Problem: Health Behavior/Discharge Planning: Goal: Ability to manage health-related needs will improve Outcome: Progressing   Problem: Clinical Measurements: Goal: Ability to maintain clinical measurements within normal limits will improve Outcome: Progressing   Problem: Clinical Measurements: Goal: Will remain free from infection Outcome: Progressing   Problem: Clinical Measurements: Goal: Diagnostic test results will improve Outcome: Progressing   Problem: Clinical Measurements: Goal: Respiratory complications will improve Outcome: Progressing   Problem: Clinical Measurements: Goal: Cardiovascular complication will be avoided Outcome: Progressing   Problem: Activity: Goal: Risk for activity intolerance will decrease Outcome: Progressing   Problem: Coping: Goal: Level of anxiety will decrease Outcome: Progressing   Problem: Elimination: Goal: Will not experience complications related to bowel motility Outcome: Progressing   Problem: Safety: Goal: Ability to remain free from injury will improve Outcome: Progressing   Problem: Pain Managment: Goal: General experience of comfort will improve and/or be controlled Outcome: Progressing
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
## 2023-06-01 NOTE — Progress Notes (Signed)
 RN approached patient in patient's room to address complaint of hunger.  RN explained to patient about NPO status and possibility of procedure today.  Patient stated "I'm not doing anything until I get some food".  RN advised patient of NPO status again and risks associated with oral intake.  RN asked if patient would accept the blood transfusion, a nicotine patch, and possible procedure today.  Patient refused all interventions and said "I'll just leave AMA".

## 2023-06-02 ENCOUNTER — Encounter (HOSPITAL_COMMUNITY): Admission: RE | Payer: Self-pay | Source: Home / Self Care

## 2023-06-02 ENCOUNTER — Inpatient Hospital Stay (HOSPITAL_COMMUNITY): Admission: RE | Admit: 2023-06-02 | Source: Home / Self Care | Admitting: Oral Surgery

## 2023-06-02 LAB — BPAM RBC
Blood Product Expiration Date: 202506062359
Blood Product Expiration Date: 202506062359
Blood Product Expiration Date: 202506062359
ISSUE DATE / TIME: 202505080517
Unit Type and Rh: 6200
Unit Type and Rh: 6200
Unit Type and Rh: 6200

## 2023-06-02 LAB — TYPE AND SCREEN
ABO/RH(D): A POS
Antibody Screen: NEGATIVE
Unit division: 0
Unit division: 0
Unit division: 0

## 2023-06-02 SURGERY — REMOVAL, HARDWARE, MANDIBLE
Anesthesia: General

## 2023-06-14 ENCOUNTER — Encounter (HOSPITAL_COMMUNITY): Payer: Self-pay

## 2023-06-14 ENCOUNTER — Other Ambulatory Visit: Payer: Self-pay

## 2023-06-14 ENCOUNTER — Emergency Department (HOSPITAL_COMMUNITY)
Admission: EM | Admit: 2023-06-14 | Discharge: 2023-06-14 | Disposition: A | Discharge status: Home / Self Care | Attending: Emergency Medicine | Admitting: Emergency Medicine

## 2023-06-14 ENCOUNTER — Emergency Department (HOSPITAL_COMMUNITY)

## 2023-06-14 DIAGNOSIS — F1410 Cocaine abuse, uncomplicated: Secondary | ICD-10-CM | POA: Diagnosis not present

## 2023-06-14 DIAGNOSIS — F1110 Opioid abuse, uncomplicated: Secondary | ICD-10-CM | POA: Insufficient documentation

## 2023-06-14 DIAGNOSIS — R220 Localized swelling, mass and lump, head: Secondary | ICD-10-CM | POA: Insufficient documentation

## 2023-06-14 DIAGNOSIS — F1910 Other psychoactive substance abuse, uncomplicated: Secondary | ICD-10-CM | POA: Diagnosis not present

## 2023-06-14 DIAGNOSIS — R4182 Altered mental status, unspecified: Secondary | ICD-10-CM | POA: Diagnosis present

## 2023-06-14 DIAGNOSIS — Z91040 Latex allergy status: Secondary | ICD-10-CM | POA: Diagnosis not present

## 2023-06-14 LAB — CBC WITH DIFFERENTIAL/PLATELET
Abs Immature Granulocytes: 0 10*3/uL (ref 0.00–0.07)
Basophils Absolute: 0 10*3/uL (ref 0.0–0.1)
Basophils Relative: 0 %
Eosinophils Absolute: 0.2 10*3/uL (ref 0.0–0.5)
Eosinophils Relative: 4 %
HCT: 29.3 % — ABNORMAL LOW (ref 36.0–46.0)
Hemoglobin: 8.7 g/dL — ABNORMAL LOW (ref 12.0–15.0)
Immature Granulocytes: 0 %
Lymphocytes Relative: 42 %
Lymphs Abs: 2.3 10*3/uL (ref 0.7–4.0)
MCH: 24.6 pg — ABNORMAL LOW (ref 26.0–34.0)
MCHC: 29.7 g/dL — ABNORMAL LOW (ref 30.0–36.0)
MCV: 83 fL (ref 80.0–100.0)
Monocytes Absolute: 0.7 10*3/uL (ref 0.1–1.0)
Monocytes Relative: 13 %
Neutro Abs: 2.2 10*3/uL (ref 1.7–7.7)
Neutrophils Relative %: 41 %
Platelets: 511 10*3/uL — ABNORMAL HIGH (ref 150–400)
RBC: 3.53 MIL/uL — ABNORMAL LOW (ref 3.87–5.11)
RDW: 17.2 % — ABNORMAL HIGH (ref 11.5–15.5)
WBC: 5.4 10*3/uL (ref 4.0–10.5)
nRBC: 0 % (ref 0.0–0.2)

## 2023-06-14 LAB — COMPREHENSIVE METABOLIC PANEL WITH GFR
ALT: 17 U/L (ref 0–44)
AST: 33 U/L (ref 15–41)
Albumin: 3.7 g/dL (ref 3.5–5.0)
Alkaline Phosphatase: 59 U/L (ref 38–126)
Anion gap: 11 (ref 5–15)
BUN: 12 mg/dL (ref 6–20)
CO2: 22 mmol/L (ref 22–32)
Calcium: 9 mg/dL (ref 8.9–10.3)
Chloride: 104 mmol/L (ref 98–111)
Creatinine, Ser: 0.65 mg/dL (ref 0.44–1.00)
GFR, Estimated: >60 mL/min (ref >60)
Glucose, Bld: 82 mg/dL (ref 70–99)
Potassium: 4.5 mmol/L (ref 3.5–5.1)
Sodium: 137 mmol/L (ref 135–145)
Total Bilirubin: 0.7 mg/dL (ref 0.0–1.2)
Total Protein: 8.3 g/dL — ABNORMAL HIGH (ref 6.5–8.1)

## 2023-06-14 LAB — ETHANOL: Alcohol, Ethyl (B): <15 mg/dL (ref <15)

## 2023-06-14 LAB — RAPID URINE DRUG SCREEN, HOSP PERFORMED
Amphetamines: NOT DETECTED
Barbiturates: NOT DETECTED
Benzodiazepines: POSITIVE — AB
Cocaine: POSITIVE — AB
Opiates: POSITIVE — AB
Tetrahydrocannabinol: NOT DETECTED

## 2023-06-14 LAB — SALICYLATE LEVEL: Salicylate Lvl: <7 mg/dL — ABNORMAL LOW (ref 7.0–30.0)

## 2023-06-14 LAB — ACETAMINOPHEN LEVEL: Acetaminophen (Tylenol), Serum: <10 ug/mL — ABNORMAL LOW (ref 10–30)

## 2023-06-14 LAB — PREGNANCY, URINE: Preg Test, Ur: NEGATIVE

## 2023-06-14 MED ORDER — ACETAMINOPHEN 325 MG PO TABS
650.0000 mg | ORAL_TABLET | Freq: Once | ORAL | Status: AC
  Administered 2023-06-14: 650 mg via ORAL
  Filled 2023-06-14: qty 2

## 2023-06-14 NOTE — ED Notes (Addendum)
 Patient is resting comfortably.

## 2023-06-14 NOTE — ED Notes (Signed)
Received report from Michael, RN.

## 2023-06-14 NOTE — ED Notes (Signed)
 Pt asleep, on the way to CT

## 2023-06-14 NOTE — ED Notes (Signed)
 Asked for pain meds for her mouth. I told her she is dc, but I will ask provider, provider notified

## 2023-06-14 NOTE — ED Notes (Signed)
 Disconnected and changed into jumpsuit with officer help. Pt was able to stand and get into wheelchair unassisted

## 2023-06-14 NOTE — ED Provider Notes (Signed)
 Patient able to ambulate without assistance.  She is awake but mildly lethargic.  Patient answering questions appropriately.  She will be discharged   Cheyenne Cotta, MD 06/14/23 1136

## 2023-06-14 NOTE — ED Notes (Signed)
Just returned from CT scan 

## 2023-06-14 NOTE — ED Notes (Addendum)
 Pt eating sandwich, officer at bedside

## 2023-06-14 NOTE — ED Notes (Signed)
 Gave patient food and orange juice, have attempted to wake her up several times to no avail, she will mostly open her eyes and nod, but falls right back asleep

## 2023-06-14 NOTE — ED Triage Notes (Signed)
 Pt in for possible withdrawal per jail. Pt sleeping and not opening eyes except to pain. She states that she has not taken anything. States she needs to urinate.

## 2023-06-14 NOTE — ED Notes (Signed)
 Pt with AMS. RN instructed by MD to in and out cath the patient even though she was refusing it. RN with 2 techs in to pt's room and explained what was going to happen. RN cathed pt and got urine sample plus extra to empty the bladder. Pt tolerated well.

## 2023-06-14 NOTE — ED Notes (Addendum)
 Pt asleep in bed, provider at bedside. Provider wants patient to eat and ambulate in the hallway. Will bring patient food to start. Pt was able to wake up via verbal stimulation

## 2023-06-14 NOTE — ED Notes (Signed)
 Patient is resting comfortably.

## 2023-06-14 NOTE — ED Notes (Signed)
 Provider came to room, stated to wait to feed patient until her head scan comes back

## 2023-06-14 NOTE — ED Notes (Signed)
 Pt discharged, sheriff waiting for ride

## 2023-06-14 NOTE — ED Notes (Signed)
 patient did finish her sandwich, she is still sleepy, but she got out of bed unassisted to squat on floor over bedpan (her preferred method) states is burns when she pees. when I tried to stand her to take a few steps she is unsteady and states she cannot walk because her legs hurt. Provider notified

## 2023-06-14 NOTE — ED Notes (Signed)
 Wiped pt up for smell of urine

## 2023-06-14 NOTE — ED Provider Notes (Signed)
 [place] [facility] AT [facility] Provider Note   CSN: [id number] Arrival date & time: 06/14/23  7829     History  Chief Complaint  Patient presents with   Altered Mental Status   Withdrawal    Gail Weber is a 38 y.o. female.  HPI     This is a 38 year old female who presents with [facility] mental status from the jail.  Limited history provided.  Per the officer at the bedside, she was picked up around 8 PM last night.  She was awake and alert at that time.  She has become increasingly somnolent.  Question whether she may withdrawing or using drugs.  Patient will not provide any further history.  However, she does arouse to verbal stimulus and nod her head to yes and no questions.  Level 5 caveat  Home Medications Prior to Admission medications   Medication Sig Start Date End Date Taking? Authorizing Provider  famotidine  (PEPCID ) 20 MG tablet Take 1 tablet (20 mg total) by mouth daily. Patient not taking: Reported on 05/31/2023 04/27/23   Kraig Peru, MD  feeding supplement (ENSURE ENLIVE / ENSURE PLUS) LIQD Take 237 mLs by mouth 3 (three) times daily between meals. Patient not taking: Reported on 05/31/2023 04/26/23   Kraig Peru, MD  ibuprofen  (ADVIL ) 800 MG tablet Starting 4/9 (when ketorolac  is finished), take 1 tablet (800 mg total) by mouth every 8 (eight) hours as needed for mild pain (pain score 1-3) or moderate pain (pain score 4-6). Patient not taking: Reported on 05/31/2023 05/02/23   Kraig Peru, MD  ketorolac  (TORADOL ) 10 MG tablet Take 1 tablet (10 mg total) by mouth 4 (four) times daily -  with meals and at bedtime. Patient not taking: Reported on 05/31/2023 04/26/23   [name], [name], MD  Oxycodone  HCl 10 MG TABS Take 1 tablet (10 mg total) by mouth every 6 (six) hours as needed for severe pain (pain score 7-10). Patient not taking: Reported on 05/31/2023 04/26/23   Kraig Peru, MD  polyethylene glycol powder (GLYCOLAX /MIRALAX ) 17 GM/SCOOP  powder Dissolve 1 capful (17 g) in water and take by mouth daily. Patient not taking: Reported on 05/31/2023 04/27/23   Kraig Peru, MD      Allergies    Latex    Review of Systems   Review of Systems  Unable to perform ROS: Acuity of condition    Physical Exam Updated Vital Signs BP 131/86   Pulse 78   Temp (!) 97.5 F (36.4 C) (Oral)   Resp 19   Ht 1.702 m (5\' 7" )   Wt 104 kg   LMP 05/30/2023 (Exact Date)   SpO2 98%   BMI 35.91 kg/m  Physical Exam Vitals and nursing note reviewed.  Constitutional:      Appearance: She is well-developed. She is obese. She is not ill-appearing.  HENT:     Head: Normocephalic and atraumatic.     Mouth/Throat:     Comments: Slight swelling noted to the right side of the face Eyes:     Pupils: Pupils are equal, round, and reactive to light.     Comments: 5 send reactive bilaterally  Cardiovascular:     Rate and Rhythm: Normal rate and regular rhythm.     Heart sounds: Normal heart sounds.  Pulmonary:     Effort: Pulmonary effort is normal. No respiratory distress.     Breath sounds: No wheezing.  Abdominal:     Palpations: Abdomen  is soft.  Musculoskeletal:     Cervical back: Neck supple.  Skin:    General: Skin is warm and dry.  Neurological:     Mental Status: She is alert.     Comments: Somnolent but arousable to verbal stimulus, moves all 4 extremities equally and spontaneously  Psychiatric:     Comments: Uncooperative     ED Results / Procedures / Treatments   Labs (all labs ordered are listed, but only abnormal results are displayed) Labs Reviewed  CBC WITH DIFFERENTIAL/PLATELET - Abnormal; Notable for the following components:      Result Value   RBC 3.53 (*)    Hemoglobin 8.7 (*)    HCT 29.3 (*)    MCH 24.6 (*)    MCHC 29.7 (*)    RDW 17.2 (*)    Platelets 511 (*)    All other components within normal limits  COMPREHENSIVE METABOLIC PANEL WITH GFR - Abnormal; Notable for the following components:   Total  Protein 8.3 (*)    All other components within normal limits  RAPID URINE DRUG SCREEN, HOSP PERFORMED - Abnormal; Notable for the following components:   Opiates POSITIVE (*)    Cocaine POSITIVE (*)    Benzodiazepines POSITIVE (*)    All other components within normal limits  ACETAMINOPHEN  LEVEL - Abnormal; Notable for the following components:   Acetaminophen  (Tylenol ), Serum <10 (*)    All other components within normal limits  SALICYLATE LEVEL - Abnormal; Notable for the following components:   Salicylate Lvl <7.0 (*)    All other components within normal limits  PREGNANCY, URINE  ETHANOL    EKG EKG Interpretation Date/Time:  Thursday Jun 14 2023 02:52:29 EDT Ventricular Rate:  56 PR Interval:  133 QRS Duration:  101 QT Interval:  435 QTC Calculation: 420 R Axis:   73  Text Interpretation: Sinus rhythm Ventricular premature complex Confirmed by Donita Furrow ([id number]) on 06/14/2023 6:18:30 AM  Radiology No results found.  Procedures Procedures    Medications Ordered in ED Medications - No data to display  ED Course/ Medical Decision Making/ A&P Clinical Course as of 06/14/23 0722  Thu Jun 14, 2023  0354 Chart reviewed.  Patient with recent history of mandible fracture with fixation in March.  She subsequently followed up in early May for increased swelling.  CT imaging was concerning for new fractures and infection which would require hardware removal.  Patient left AGAINST MEDICAL ADVICE. [CH]  6045 Patient is sleeping on exam.  Vital signs are stable.  Respiratory rate is 18.  Pupils remain fives.  She is arousable.  Suspect that she may be coming down off of polysubstance abuse.  Requested nursing get her up and ambulate her.  She may eat. [CH]    Clinical Course User Index [CH] Jaques Mineer, Vonzella Guernsey, MD                                 Medical Decision Making Amount and/or Complexity of Data Reviewed Labs: ordered. Radiology: ordered.  Risk OTC  drugs.   This patient presents to the ED for concern of altered mental status, this involves an extensive number of treatment options, and is a complaint that carries with it a high risk of complications and morbidity.  I considered the following differential and admission for this acute, potentially life threatening condition.  The differential diagnosis includes intoxication, polysubstance abuse, encephalopathy, head bleed  MDM:  This is a 38 year old female who presents from jail with concerns for increasing somnolence and altered mental status.  She is nontoxic.  Vital signs are reassuring.  She is somnolent but arousable.  Moves all 4 extremities.  Does not appear focal.  She does have some swelling of the right mandible but this is likely related to her known prior fracture with fixation.  She was admitted for infection and plan for removal of hardware but left AGAINST MEDICAL ADVICE.  Does not have any complaints today regarding this.  She is afebrile.  Labs obtained.  There was some delay in getting lab work.  CBC, Tylenol , salicylate levels, and CMP reassuring.  UDS is positive for marijuana, cocaine, and opiates.  She does not appear to be primarily an opiate overdose as she has reactive pupils and a normal respiratory rate.  No indication for Narcan .  Patient was allowed to sleep.  On recheck first thing in the morning, she is arousable.  Requested nursing get her up and walk her.  Discussed with my colleague that if she cannot walk or eat, she may require CT imaging although I feel that head bleed or intracranial abnormality is less likely.  No reports of trauma.  (Labs, imaging, consults)  Labs: I Ordered, and personally interpreted labs.  The pertinent results include: CBC, CMP, EtOH, Tylenol , salicylate level, UDS  Imaging Studies ordered: I ordered imaging studies including none I independently visualized and interpreted imaging. I agree with the radiologist  interpretation  Additional history obtained from chart review.  External records from outside source obtained and reviewed including recent admission  Cardiac Monitoring: The patient was maintained on a cardiac monitor.  If on the cardiac monitor, I personally viewed and interpreted the cardiac monitored which showed an underlying rhythm of: NS  Reevaluation: After the interventions noted above, I reevaluated the patient and found that they have :improved  Social Determinants of Health:  currently in jail  Disposition: Discharge  Co morbidities that complicate the patient evaluation  Past Medical History:  Diagnosis Date   Asthma    Ectopic pregnancy      Medicines Meds ordered this encounter  Medications   acetaminophen  (TYLENOL ) tablet 650 mg    I have reviewed the patients home medicines and have made adjustments as needed  Problem List / ED Course: Problem List Items Addressed This Visit   None Visit Diagnoses       Polysubstance abuse (HCC)    -  Primary                   Final Clinical Impression(s) / ED Diagnoses Final diagnoses:  Polysubstance abuse (HCC)    Rx / DC Orders ED Discharge Orders     None         Rory Collard, MD 06/14/23 2258

## 2023-06-14 NOTE — Discharge Instructions (Signed)
 You were seen today for altered mental status.  This is likely related to your polysubstance abuse.

## 2023-07-04 ENCOUNTER — Emergency Department (HOSPITAL_COMMUNITY)

## 2023-07-04 ENCOUNTER — Emergency Department (HOSPITAL_COMMUNITY)
Admission: EM | Admit: 2023-07-04 | Discharge: 2023-07-05 | Disposition: A | Discharge status: Home / Self Care | Attending: Emergency Medicine | Admitting: Emergency Medicine

## 2023-07-04 ENCOUNTER — Encounter (HOSPITAL_COMMUNITY): Payer: Self-pay | Admitting: Emergency Medicine

## 2023-07-04 ENCOUNTER — Other Ambulatory Visit: Payer: Self-pay

## 2023-07-04 DIAGNOSIS — Z91040 Latex allergy status: Secondary | ICD-10-CM | POA: Insufficient documentation

## 2023-07-04 DIAGNOSIS — J45909 Unspecified asthma, uncomplicated: Secondary | ICD-10-CM | POA: Insufficient documentation

## 2023-07-04 DIAGNOSIS — F1910 Other psychoactive substance abuse, uncomplicated: Secondary | ICD-10-CM | POA: Insufficient documentation

## 2023-07-04 DIAGNOSIS — M7122 Synovial cyst of popliteal space [Baker], left knee: Secondary | ICD-10-CM | POA: Insufficient documentation

## 2023-07-04 LAB — CBC WITH DIFFERENTIAL/PLATELET
Abs Immature Granulocytes: 0.02 10*3/uL (ref 0.00–0.07)
Basophils Absolute: 0 10*3/uL (ref 0.0–0.1)
Basophils Relative: 0 %
Eosinophils Absolute: 0.2 10*3/uL (ref 0.0–0.5)
Eosinophils Relative: 3 %
HCT: 29.9 % — ABNORMAL LOW (ref 36.0–46.0)
Hemoglobin: 9.2 g/dL — ABNORMAL LOW (ref 12.0–15.0)
Immature Granulocytes: 0 %
Lymphocytes Relative: 27 %
Lymphs Abs: 2.1 10*3/uL (ref 0.7–4.0)
MCH: 23.5 pg — ABNORMAL LOW (ref 26.0–34.0)
MCHC: 30.8 g/dL (ref 30.0–36.0)
MCV: 76.5 fL — ABNORMAL LOW (ref 80.0–100.0)
Monocytes Absolute: 0.7 10*3/uL (ref 0.1–1.0)
Monocytes Relative: 9 %
Neutro Abs: 4.7 10*3/uL (ref 1.7–7.7)
Neutrophils Relative %: 61 %
Platelets: 544 10*3/uL — ABNORMAL HIGH (ref 150–400)
RBC: 3.91 MIL/uL (ref 3.87–5.11)
RDW: 15.5 % (ref 11.5–15.5)
WBC: 7.8 10*3/uL (ref 4.0–10.5)
nRBC: 0 % (ref 0.0–0.2)

## 2023-07-04 LAB — COMPREHENSIVE METABOLIC PANEL WITH GFR
ALT: 26 U/L (ref 0–44)
AST: 47 U/L — ABNORMAL HIGH (ref 15–41)
Albumin: 3.1 g/dL — ABNORMAL LOW (ref 3.5–5.0)
Alkaline Phosphatase: 63 U/L (ref 38–126)
Anion gap: 13 (ref 5–15)
BUN: 14 mg/dL (ref 6–20)
CO2: 22 mmol/L (ref 22–32)
Calcium: 9 mg/dL (ref 8.9–10.3)
Chloride: 104 mmol/L (ref 98–111)
Creatinine, Ser: 0.68 mg/dL (ref 0.44–1.00)
GFR, Estimated: >60 mL/min (ref >60)
Glucose, Bld: 66 mg/dL — ABNORMAL LOW (ref 70–99)
Potassium: 5 mmol/L (ref 3.5–5.1)
Sodium: 139 mmol/L (ref 135–145)
Total Bilirubin: 1 mg/dL (ref 0.0–1.2)
Total Protein: 7.4 g/dL (ref 6.5–8.1)

## 2023-07-04 LAB — RAPID URINE DRUG SCREEN, HOSP PERFORMED
Amphetamines: NOT DETECTED
Barbiturates: NOT DETECTED
Benzodiazepines: NOT DETECTED
Cocaine: POSITIVE — AB
Opiates: NOT DETECTED
Tetrahydrocannabinol: NOT DETECTED

## 2023-07-04 LAB — CBG MONITORING, ED: Glucose-Capillary: 90 mg/dL (ref 70–99)

## 2023-07-04 LAB — ETHANOL: Alcohol, Ethyl (B): <15 mg/dL (ref <15)

## 2023-07-04 LAB — HCG, QUANTITATIVE, PREGNANCY: hCG, Beta Chain, Quant, S: 1 m[IU]/mL (ref <5)

## 2023-07-04 NOTE — ED Provider Notes (Signed)
 [place] EMERGENCY DEPARTMENT AT [facility] Provider Note   CSN: [id number] Arrival date & time: 07/04/23  4098     History  Chief Complaint  Gail Weber presents with   Drug Overdose    Gail Weber is a 38 y.o. female with past medical history of asthma, ectopic pregnancy [facility] emergency department [facility] for evaluation of a possible overdose on fentanyl .  Gail Weber was given 12 mg Narcan  by jail staff.  They believe that Gail Weber fell but are unable to give any specifying information at all.  At time of HPI, Gail Weber is moaning but unable to follow commands   Drug Overdose Pertinent negatives include no chest pain, no abdominal pain, no headaches and no shortness of breath.       Home Medications Prior to Admission medications   Medication Sig Start Date End Date Taking? Authorizing Provider  famotidine  (PEPCID ) 20 MG tablet Take 1 tablet (20 mg total) by mouth daily. Gail Weber not taking: Reported on 05/31/2023 04/27/23   Kraig Peru, MD  feeding supplement (ENSURE ENLIVE / ENSURE PLUS) LIQD Take 237 mLs by mouth 3 (three) times daily between meals. Gail Weber not taking: Reported on 05/31/2023 04/26/23   Kraig Peru, MD  ibuprofen  (ADVIL ) 800 MG tablet Starting 4/9 (when ketorolac  is finished), take 1 tablet (800 mg total) by mouth every 8 (eight) hours as needed for mild pain (pain score 1-3) or moderate pain (pain score 4-6). Gail Weber not taking: Reported on 05/31/2023 05/02/23   Kraig Peru, MD  ketorolac  (TORADOL ) 10 MG tablet Take 1 tablet (10 mg total) by mouth 4 (four) times daily -  with meals and at bedtime. Gail Weber not taking: Reported on 05/31/2023 04/26/23   [name], [name], MD  Oxycodone  HCl 10 MG TABS Take 1 tablet (10 mg total) by mouth every 6 (six) hours as needed for severe pain (pain score 7-10). Gail Weber not taking: Reported on 05/31/2023 04/26/23   [name], [name], MD  polyethylene glycol powder (GLYCOLAX /MIRALAX ) 17 GM/SCOOP powder Dissolve 1  capful (17 g) in water and take by mouth daily. Gail Weber not taking: Reported on 05/31/2023 04/27/23   Kraig Peru, MD      Allergies    Latex    Review of Systems   Review of Systems  Constitutional:  Negative for chills, fatigue and fever.  Respiratory:  Negative for cough, chest tightness, shortness of breath and wheezing.   Cardiovascular:  Negative for chest pain and palpitations.  Gastrointestinal:  Negative for abdominal pain, constipation, diarrhea, nausea and vomiting.  Neurological:  Negative for dizziness, seizures, weakness, light-headedness, numbness and headaches.    Physical Exam Updated Vital Signs BP 121/60   Pulse 63   Temp 98 F (36.7 C) (Oral)   Resp 13   LMP 05/30/2023 (Exact Date)   SpO2 100%  Physical Exam Vitals and nursing note reviewed.  Constitutional:      General: Gail Weber is not in acute distress.    Appearance: Normal appearance. Gail Weber is not diaphoretic.     Comments: Somnolent   HENT:     Head: Normocephalic and atraumatic. No raccoon eyes or Battle's sign.     Comments: Small hematoma to anterior forehead    Right Ear: External ear normal. No hemotympanum.     Left Ear: External ear normal. No hemotympanum.     Nose: Nose normal.     Right Nostril: No epistaxis or septal hematoma.     Left Nostril: No  epistaxis or septal hematoma.     Mouth/Throat:     Mouth: Mucous membranes are moist. No injury or lacerations.  Eyes:     General:        Right eye: No discharge.        Left eye: No discharge.     Conjunctiva/sclera: Conjunctivae normal.     Pupils: Pupils are equal, round, and reactive to light.     Comments: No subconjunctival hemorrhage, hyphema, tear drop pupil, or fluid leakage bilaterally. Pinpoint pupils bilaterally  Cardiovascular:     Rate and Rhythm: Normal rate.     Pulses: Normal pulses.          Radial pulses are 2+ on the right side and 2+ on the left side.       Dorsalis pedis pulses are 2+ on the right side and 2+ on the  left side.  Pulmonary:     Effort: Pulmonary effort is normal. No respiratory distress.     Breath sounds: Normal breath sounds. No wheezing.  Chest:     Chest wall: No tenderness.  Abdominal:     General: Bowel sounds are normal. There is no distension.     Palpations: Abdomen is soft.     Tenderness: There is no abdominal tenderness.  Musculoskeletal:     Cervical back: Full passive range of motion without pain. No deformity or bony tenderness. Normal range of motion.     Thoracic back: No deformity or bony tenderness. Normal range of motion.     Lumbar back: No deformity or bony tenderness. Normal range of motion.     Right hip: No bony tenderness or crepitus.     Left hip: No bony tenderness or crepitus.     Right lower leg: No edema.     Left lower leg: No edema.     Comments: No obvious deformity to joints or long bones Pelvis stable with no shortening or rotation of LE bilaterally  Skin:    General: Skin is warm and dry.     Capillary Refill: Capillary refill takes less than 2 seconds.     Coloration: Skin is not jaundiced or pale.  Neurological:     Mental Status: Gail Weber is alert.     GCS: GCS eye subscore is 1. GCS verbal subscore is 2. GCS motor subscore is 5.     Deep Tendon Reflexes: Reflexes are normal and symmetric.     Comments: Localizing to touch and pushing stimuli away. Does not follow commands but will grunt occasionally as a response     ED Results / Procedures / Treatments   Labs (all labs ordered are listed, but only abnormal results are displayed) Labs Reviewed  COMPREHENSIVE METABOLIC PANEL WITH GFR - Abnormal; Notable for the following components:      Result Value   Glucose, Bld 66 (*)    Albumin 3.1 (*)    AST 47 (*)    All other components within normal limits  RAPID URINE DRUG SCREEN, HOSP PERFORMED - Abnormal; Notable for the following components:   Cocaine POSITIVE (*)    All other components within normal limits  CBC WITH  DIFFERENTIAL/PLATELET - Abnormal; Notable for the following components:   Hemoglobin 9.2 (*)    HCT 29.9 (*)    MCV 76.5 (*)    MCH 23.5 (*)    Platelets 544 (*)    All other components within normal limits  ETHANOL  HCG, QUANTITATIVE, PREGNANCY  CBG MONITORING, ED  EKG EKG Interpretation Date/Time:  Wednesday July 04 2023 18:19:24 EDT Ventricular Rate:  68 PR Interval:  151 QRS Duration:  89 QT Interval:  437 QTC Calculation: 465 R Axis:   79  Text Interpretation: Sinus rhythm since last tracing no significant change Confirmed by Hershel Los [phone]) on 07/04/2023 9:46:00 PM  Radiology CT Head Wo Contrast Result Date: 07/04/2023 CLINICAL DATA:  Recent fentanyl  overdose EXAM: CT HEAD WITHOUT CONTRAST CT CERVICAL SPINE WITHOUT CONTRAST TECHNIQUE: Multidetector CT imaging of the head and cervical spine was performed following the standard protocol without intravenous contrast. Multiplanar CT image reconstructions of the cervical spine were also generated. RADIATION DOSE REDUCTION: This exam was performed according to the departmental dose-optimization program which includes automated exposure control, adjustment of the mA and/or kV according to Gail Weber size and/or use of iterative reconstruction technique. COMPARISON:  None Available. FINDINGS: CT HEAD FINDINGS Brain: No evidence of acute infarction, hemorrhage, hydrocephalus, extra-axial collection or mass lesion/mass effect. Vascular: No hyperdense vessel or unexpected calcification. Skull: Normal. Negative for fracture or focal lesion. Sinuses/Orbits: No acute finding. Other: None. CT CERVICAL SPINE FINDINGS Alignment: Within normal limits. Skull base and vertebrae: 7 cervical segments are well visualized. Vertebral body height is well maintained. No acute fracture or acute facet abnormality is noted in the cervical spine. Prior right mandibular fracture with some healing is noted. The known fixation plate is not well appreciated on  this exam. The odontoid is within normal limits. Soft tissues and spinal canal: Surrounding soft tissue structures are within normal limits. Upper chest: Visualized lung apices are unremarkable. Other: None IMPRESSION: CT of the head: No acute intracranial abnormality noted. CT of the cervical spine: No acute abnormality in the cervical spine is seen. Healing right mandibular fracture is noted. Electronically Signed   By: Violeta Grey M.D.   On: 07/04/2023 21:04   CT Cervical Spine Wo Contrast Result Date: 07/04/2023 CLINICAL DATA:  Recent fentanyl  overdose EXAM: CT HEAD WITHOUT CONTRAST CT CERVICAL SPINE WITHOUT CONTRAST TECHNIQUE: Multidetector CT imaging of the head and cervical spine was performed following the standard protocol without intravenous contrast. Multiplanar CT image reconstructions of the cervical spine were also generated. RADIATION DOSE REDUCTION: This exam was performed according to the departmental dose-optimization program which includes automated exposure control, adjustment of the mA and/or kV according to Gail Weber size and/or use of iterative reconstruction technique. COMPARISON:  None Available. FINDINGS: CT HEAD FINDINGS Brain: No evidence of acute infarction, hemorrhage, hydrocephalus, extra-axial collection or mass lesion/mass effect. Vascular: No hyperdense vessel or unexpected calcification. Skull: Normal. Negative for fracture or focal lesion. Sinuses/Orbits: No acute finding. Other: None. CT CERVICAL SPINE FINDINGS Alignment: Within normal limits. Skull base and vertebrae: 7 cervical segments are well visualized. Vertebral body height is well maintained. No acute fracture or acute facet abnormality is noted in the cervical spine. Prior right mandibular fracture with some healing is noted. The known fixation plate is not well appreciated on this exam. The odontoid is within normal limits. Soft tissues and spinal canal: Surrounding soft tissue structures are within normal limits.  Upper chest: Visualized lung apices are unremarkable. Other: None IMPRESSION: CT of the head: No acute intracranial abnormality noted. CT of the cervical spine: No acute abnormality in the cervical spine is seen. Healing right mandibular fracture is noted. Electronically Signed   By: Violeta Grey M.D.   On: 07/04/2023 21:04    Procedures Procedures    Medications Ordered in ED Medications - No data to display  ED  Course/ Medical Decision Making/ A&P Clinical Course as of 07/04/23 2359  Wed Jul 04, 2023  2207 Hemoglobin(!): 9.2 Baseline 7.1-8.7 over past month [LB]    Clinical Course User Index [LB] Royann Cords, PA                                 Medical Decision Making Amount and/or Complexity of Data Reviewed Labs: ordered. Decision-making details documented in ED Course. Radiology: ordered.   Gail Weber presents to the ED for concern of LOC, head injury, somnolence, this involves an extensive number of treatment options, and is a complaint that carries with it a high risk of complications and morbidity.  The differential diagnosis includes ICH, electrolyte abnormality, drug toxidrome, hypoglycemia, hyperglycemia   Co morbidities that complicate the Gail Weber evaluation  See HPI   Additional history obtained:  Additional history obtained from Nursing   External records from outside source obtained and reviewed including triage note   Lab Tests:  I Ordered, and personally interpreted labs.  The pertinent results include:   Hgb 9.2 (baseline 7.1-8.7 over past month) PLT 544 (was 511 2 weeks ago) UDS positive for cocaine CBG 90 AST 47   Imaging Studies ordered:  I ordered imaging studies including CT head, cervical spine  I independently visualized and interpreted imaging which showed no ICH I agree with the radiologist interpretation   Cardiac Monitoring:  The Gail Weber was maintained on a cardiac monitor.  I personally viewed and interpreted the cardiac  monitored which showed an underlying rhythm of: NSR     Problem List / ED Course:  Polysubstance abuse UDS positive for cocaine and Gail Weber possibly had fentanyl  which was not picked up by UDS CBG 90 CT head negative for ICH No obvious injuries from fall other than minor abrasion to forehead Has presented recently for somnolence, polysubstance abuse on 06/14/23 We will continue to reassess and let Gail Weber met to freedom   Reevaluation:  After the interventions noted above, I reevaluated the Gail Weber and found that they have :stayed the same    Dispostion:  After consideration of the diagnostic results and the patients response to treatment, I feel that the patent would benefit from outpatient management following metabolism to freedom.  At this time, Gail Weber has been observed in ED for 6 hours.  Signout to Abelardo Hoehn pending metabolism to freedom  Dr. Wynema Heck individually assessed Gail Weber and agrees to plan Final Clinical Impression(s) / ED Diagnoses Final diagnoses:  Polysubstance abuse [facility]    Rx / DC Orders ED Discharge Orders     None         Royann Cords, PA 07/05/23 0000    Rory Collard, MD 07/05/23 [phone]

## 2023-07-04 NOTE — ED Notes (Signed)
 Patient returned from CT, LEO at bedside.

## 2023-07-04 NOTE — ED Triage Notes (Signed)
 Pt BIB [facility] from jail with reports of overdose on fentanyl . Pt was given 12mg  narcan  by jail staff. At this time pt is not talking, only moaning. Pt not following commands at this time.

## 2023-07-04 NOTE — ED Notes (Signed)
 POC CBG 90

## 2023-07-04 NOTE — ED Notes (Signed)
 Patient transported to CT

## 2023-07-04 NOTE — ED Notes (Signed)
Sheriff at bedside.  

## 2023-07-05 ENCOUNTER — Emergency Department (HOSPITAL_COMMUNITY)

## 2023-07-05 ENCOUNTER — Emergency Department (HOSPITAL_BASED_OUTPATIENT_CLINIC_OR_DEPARTMENT_OTHER)

## 2023-07-05 DIAGNOSIS — M7989 Other specified soft tissue disorders: Secondary | ICD-10-CM | POA: Diagnosis not present

## 2023-07-05 DIAGNOSIS — M79672 Pain in left foot: Secondary | ICD-10-CM | POA: Diagnosis not present

## 2023-07-05 NOTE — ED Provider Notes (Signed)
  Physical Exam  BP 114/67   Pulse 68   Temp 98.1 F (36.7 C) (Oral)   Resp 16   LMP 05/30/2023 (Exact Date)   SpO2 100%   Physical Exam  Procedures  Procedures  ED Course / MDM   Clinical Course as of 07/05/23 0548  Wed Jul 04, 2023  2207 Hemoglobin(!): 9.2 Baseline 7.1-8.7 over past month [LB]    Clinical Course User Index [LB] Royann Cords, PA   Medical Decision Making Amount and/or Complexity of Data Reviewed Labs: ordered. Decision-making details documented in ED Course. Radiology: ordered.   Patient care assumed at shift handoff with plans to allow patient to metabolize to freedom.  Patient from jail, was found unresponsive administered Narcan .  This morning patient woke up and was able to urinate without difficulty.  She then endorsed left-sided leg swelling and foot pain with no known injury.  Plan to order x-ray and DVT study.  Oncoming provider to follow for results.  Disposition pending results of imaging and ambulation trial.  Patient care transferred to [facility], PA-C       Kaitlinn Iversen B, PA-C 07/05/23 4782    Rory Collard, MD 07/05/23 [phone]

## 2023-07-05 NOTE — ED Notes (Signed)
 Patient awoke and stated she needed to urinate. Patient placed on bedpan. ED provider notified that patient was awake. Provider asked to have patient ambulate. Patient refused to get out of bed, patient refused to attempt standing at bedside. Patient states,  I just want to go back to sleep.Patient then removed monitoring equipment and refused to allow ED staff to reapply said equipment. Provider notified, Provider to follow up.

## 2023-07-05 NOTE — ED Provider Notes (Signed)
 38 year old female presents today for concern of overdose. Signout to me pending metabolization to freedom.   Physical Exam  BP 124/71 (BP Location: Left Arm)   Pulse 75   Temp 98.8 F (37.1 C) (Oral)   Resp 14   LMP 05/30/2023 (Exact Date)   SpO2 100%     Procedures  Procedures  ED Course / MDM   Clinical Course as of 07/05/23 1008  Wed Jul 04, 2023  2207 Hemoglobin(!): 9.2 Baseline 7.1-8.7 over past month [LB]    Clinical Course User Index [LB] Royann Cords, PA   Medical Decision Making Amount and/or Complexity of Data Reviewed Labs: ordered. Decision-making details documented in ED Course. Radiology: ordered.   Blood work overall reassuring.  Vitals reassuring. Left foot x-ray without acute bony abnormality. Left DVT study without evidence of DVT but does show evidence of ruptured Baker's cyst. Conservative management discussed. Discharged in stable condition.  Return precaution discussed.       Lucina Sabal, PA-C 07/05/23 1009    Albertus Hughs, DO 07/05/23 1025

## 2023-07-05 NOTE — Discharge Instructions (Addendum)
 Follow-up with your oral surgeon.  You also have a Baker's cyst in your left leg.  This typically resolves on its own.  I have listed an orthopedist in case you need to follow-up with them.  Return for any emergent symptoms.

## 2023-07-05 NOTE — ED Notes (Signed)
 ED Provider at bedside.

## 2023-07-05 NOTE — ED Notes (Signed)
 Intentional rounding: Pt alert, and said yes in response to pain, but refused to answer any more questions and went back to sleep. Pt complaint with temperature assessment.

## 2023-07-05 NOTE — Progress Notes (Signed)
 Left lower extremity venous  has been completed. Refer to [facility] under chart review to view preliminary results.   07/05/2023  9:21 AM Eyoel Throgmorton, Hollace Lund

## 2023-07-05 NOTE — ED Notes (Signed)
 X-ray at bedside

## 2023-07-05 NOTE — ED Notes (Signed)
 Pt provided sandwich bad and drinks. [place] officers at bedside. [place] waiting for car to transport pt.

## 2023-07-05 NOTE — ED Notes (Signed)
 Pt pending discharge, waiting for transport.

## 2024-02-15 ENCOUNTER — Other Ambulatory Visit (HOSPITAL_COMMUNITY): Payer: Self-pay

## 2024-02-15 ENCOUNTER — Emergency Department (HOSPITAL_COMMUNITY)
Admission: EM | Admit: 2024-02-15 | Discharge: 2024-02-15 | Disposition: A | Discharge status: Home / Self Care | Attending: Emergency Medicine | Admitting: Emergency Medicine

## 2024-02-15 ENCOUNTER — Emergency Department (HOSPITAL_COMMUNITY)

## 2024-02-15 DIAGNOSIS — J45909 Unspecified asthma, uncomplicated: Secondary | ICD-10-CM | POA: Insufficient documentation

## 2024-02-15 DIAGNOSIS — Z91040 Latex allergy status: Secondary | ICD-10-CM | POA: Insufficient documentation

## 2024-02-15 DIAGNOSIS — Z7951 Long term (current) use of inhaled steroids: Secondary | ICD-10-CM | POA: Diagnosis not present

## 2024-02-15 DIAGNOSIS — R600 Localized edema: Secondary | ICD-10-CM | POA: Diagnosis not present

## 2024-02-15 DIAGNOSIS — J4521 Mild intermittent asthma with (acute) exacerbation: Secondary | ICD-10-CM

## 2024-02-15 DIAGNOSIS — J45901 Unspecified asthma with (acute) exacerbation: Secondary | ICD-10-CM | POA: Diagnosis not present

## 2024-02-15 DIAGNOSIS — R059 Cough, unspecified: Secondary | ICD-10-CM | POA: Diagnosis present

## 2024-02-15 LAB — CBC
HCT: 29.3 % — ABNORMAL LOW (ref 36.0–46.0)
Hemoglobin: 8.5 g/dL — ABNORMAL LOW (ref 12.0–15.0)
MCH: 19.9 pg — ABNORMAL LOW (ref 26.0–34.0)
MCHC: 29 g/dL — ABNORMAL LOW (ref 30.0–36.0)
MCV: 68.6 fL — ABNORMAL LOW (ref 80.0–100.0)
Platelets: 329 K/uL (ref 150–400)
RBC: 4.27 MIL/uL (ref 3.87–5.11)
RDW: 16.5 % — ABNORMAL HIGH (ref 11.5–15.5)
WBC: 7 K/uL (ref 4.0–10.5)
nRBC: 0 % (ref 0.0–0.2)

## 2024-02-15 LAB — COMPREHENSIVE METABOLIC PANEL WITH GFR
ALT: 29 U/L (ref 0–44)
AST: 39 U/L (ref 15–41)
Albumin: 3.8 g/dL (ref 3.5–5.0)
Alkaline Phosphatase: 64 U/L (ref 38–126)
Anion gap: 8 (ref 5–15)
BUN: 18 mg/dL (ref 6–20)
CO2: 27 mmol/L (ref 22–32)
Calcium: 9 mg/dL (ref 8.9–10.3)
Chloride: 104 mmol/L (ref 98–111)
Creatinine, Ser: 0.64 mg/dL (ref 0.44–1.00)
GFR, Estimated: >60 mL/min
Glucose, Bld: 93 mg/dL (ref 70–99)
Potassium: 3.7 mmol/L (ref 3.5–5.1)
Sodium: 140 mmol/L (ref 135–145)
Total Bilirubin: 0.4 mg/dL (ref 0.0–1.2)
Total Protein: 7.8 g/dL (ref 6.5–8.1)

## 2024-02-15 LAB — RESP PANEL BY RT-PCR (RSV, FLU A&B, COVID)  RVPGX2
Influenza A by PCR: NEGATIVE
Influenza B by PCR: NEGATIVE
Resp Syncytial Virus by PCR: NEGATIVE
SARS Coronavirus 2 by RT PCR: NEGATIVE

## 2024-02-15 LAB — PRO BRAIN NATRIURETIC PEPTIDE: Pro Brain Natriuretic Peptide: <50 pg/mL

## 2024-02-15 LAB — BLOOD GAS, VENOUS
Acid-Base Excess: 5.7 mmol/L — ABNORMAL HIGH (ref 0.0–2.0)
Bicarbonate: 31.1 mmol/L — ABNORMAL HIGH (ref 20.0–28.0)
O2 Saturation: 86.2 %
Patient temperature: 37
pCO2, Ven: 49 mmHg (ref 44–60)
pH, Ven: 7.41 (ref 7.25–7.43)
pO2, Ven: 53 mmHg — ABNORMAL HIGH (ref 32–45)

## 2024-02-15 LAB — HCG, SERUM, QUALITATIVE: Preg, Serum: NEGATIVE

## 2024-02-15 LAB — TROPONIN T, HIGH SENSITIVITY: Troponin T High Sensitivity: <6 ng/L (ref 0–19)

## 2024-02-15 MED ORDER — PREDNISONE 50 MG PO TABS
50.0000 mg | ORAL_TABLET | Freq: Every day | ORAL | 0 refills | Status: AC
  Filled 2024-02-15: qty 5, 5d supply, fill #0

## 2024-02-15 MED ORDER — ALBUTEROL SULFATE (2.5 MG/3ML) 0.083% IN NEBU
10.0000 mg/h | INHALATION_SOLUTION | Freq: Once | RESPIRATORY_TRACT | Status: AC
  Administered 2024-02-15: 10 mg/h via RESPIRATORY_TRACT
  Filled 2024-02-15 (×2): qty 3

## 2024-02-15 MED ORDER — MAGNESIUM SULFATE 2 GM/50ML IV SOLN
2.0000 g | Freq: Once | INTRAVENOUS | Status: AC
  Administered 2024-02-15: 2 g via INTRAVENOUS
  Filled 2024-02-15: qty 50

## 2024-02-15 MED ORDER — ALBUTEROL SULFATE HFA 108 (90 BASE) MCG/ACT IN AERS
1.0000 | INHALATION_SPRAY | RESPIRATORY_TRACT | Status: DC | PRN
  Filled 2024-02-15: qty 6.7

## 2024-02-15 MED ORDER — ALBUTEROL SULFATE HFA 108 (90 BASE) MCG/ACT IN AERS
1.0000 | INHALATION_SPRAY | Freq: Four times a day (QID) | RESPIRATORY_TRACT | 0 refills | Status: DC | PRN
  Filled 2024-02-15: qty 6.7, 25d supply, fill #0

## 2024-02-15 NOTE — Discharge Instructions (Signed)
 Take the medications as prescribed to help with your asthma exacerbation.  Follow-up with your primary care doctor next week to be rechecked to make sure you are improving.  Return to the ED as needed for worsening symptoms

## 2024-02-15 NOTE — ED Triage Notes (Signed)
 Patient BIBA coming [facility], 94% RA, wheezing, patient out of inhaler, given 10 mg albuterol1/mg atrovent/125 solumedrol given en route. HX of asthma. 20g left hand.

## 2024-02-15 NOTE — ED Provider Notes (Signed)
 " [place] [facility] AT [facility] Provider Note   CSN: [id number] Arrival date & time: 02/15/24  9142     Patient presents with: Shortness of Breath   Gail Weber is a 39 y.o. female.    Shortness of Breath    Patient has a history of substance abuse, osteomyelitis, asthma.  Patient presents to the ED with complaints of shortness of breath.  Patient states that started this morning.  She has been coughing and wheezing.  She feels short of breath.  She denies any fevers.  No chest pain.  No vomiting or diarrhea.  Prior to Admission medications  Medication Sig Start Date End Date Taking? Authorizing Provider  albuterol  (VENTOLIN  HFA) 108 (90 Base) MCG/ACT inhaler Inhale 1-2 puffs into the lungs every 6 (six) hours as needed for wheezing or shortness of breath. 02/15/24  Yes Randol Simmonds, MD  predniSONE (DELTASONE) 50 MG tablet Take 1 tablet (50 mg total) by mouth daily for 5 days. 02/15/24 02/20/24 Yes Randol Simmonds, MD  famotidine  (PEPCID ) 20 MG tablet Take 1 tablet (20 mg total) by mouth daily. Patient not taking: Reported on 05/31/2023 04/27/23   [name], MD  feeding supplement (ENSURE ENLIVE / ENSURE PLUS) LIQD Take 237 mLs by mouth 3 (three) times daily between meals. Patient not taking: Reported on 05/31/2023 04/26/23   [name], MD  ibuprofen  (ADVIL ) 800 MG tablet Starting 4/9 (when ketorolac  is finished), take 1 tablet (800 mg total) by mouth every 8 (eight) hours as needed for mild pain (pain score 1-3) or moderate pain (pain score 4-6). Patient not taking: Reported on 05/31/2023 05/02/23   [name], MD  ketorolac  (TORADOL ) 10 MG tablet Take 1 tablet (10 mg total) by mouth 4 (four) times daily -  with meals and at bedtime. Patient not taking: Reported on 05/31/2023 04/26/23   [name], [name], MD  Oxycodone  HCl 10 MG TABS Take 1 tablet (10 mg total) by mouth every 6 (six) hours as needed for severe pain (pain score 7-10). Patient not taking: Reported on  05/31/2023 04/26/23   [name], [name], MD  polyethylene glycol powder (GLYCOLAX /MIRALAX ) 17 GM/SCOOP powder Dissolve 1 capful (17 g) in water and take by mouth daily. Patient not taking: Reported on 05/31/2023 04/27/23   [name], MD    Allergies: Latex    Review of Systems  Respiratory:  Positive for shortness of breath.     Updated Vital Signs BP 135/71   Pulse 95   Temp 98.5 F (36.9 C) (Oral)   Resp 20   SpO2 91%   Physical Exam Vitals and nursing note reviewed.  Constitutional:      Appearance: She is well-developed. She is ill-appearing.     Comments: Somnolent  HENT:     Head: Normocephalic and atraumatic.     Right Ear: External ear normal.     Left Ear: External ear normal.  Eyes:     General: No scleral icterus.       Right eye: No discharge.        Left eye: No discharge.     Conjunctiva/sclera: Conjunctivae normal.  Neck:     Trachea: No tracheal deviation.  Cardiovascular:     Rate and Rhythm: Normal rate and regular rhythm.  Pulmonary:     Effort: Pulmonary effort is normal. No respiratory distress.     Breath sounds: No stridor. Wheezing present. No rales.  Abdominal:     General:  Bowel sounds are normal. There is no distension.     Palpations: Abdomen is soft.     Tenderness: There is no abdominal tenderness. There is no guarding or rebound.  Musculoskeletal:        General: No tenderness or deformity.     Cervical back: Neck supple.     Right lower leg: Edema present.     Left lower leg: Edema present.     Comments: Mild edema bilateral lower extremities  Skin:    General: Skin is warm and dry.     Findings: No rash.  Neurological:     General: No focal deficit present.     Mental Status: She is alert.     Cranial Nerves: No cranial nerve deficit, dysarthria or facial asymmetry.     Sensory: No sensory deficit.     Motor: No abnormal muscle tone or seizure activity.     Coordination: Coordination normal.  Psychiatric:        Mood and  Affect: Mood normal.     (all labs ordered are listed, but only abnormal results are displayed) Labs Reviewed  CBC - Abnormal; Notable for the following components:      Result Value   Hemoglobin 8.5 (*)    HCT 29.3 (*)    MCV 68.6 (*)    MCH 19.9 (*)    MCHC 29.0 (*)    RDW 16.5 (*)    All other components within normal limits  BLOOD GAS, VENOUS - Abnormal; Notable for the following components:   pO2, Ven 53 (*)    Bicarbonate 31.1 (*)    Acid-Base Excess 5.7 (*)    All other components within normal limits  RESP PANEL BY RT-PCR (RSV, FLU A&B, COVID)  [id number]  COMPREHENSIVE METABOLIC PANEL WITH GFR  HCG, SERUM, QUALITATIVE  PRO BRAIN NATRIURETIC PEPTIDE  TROPONIN T, HIGH SENSITIVITY    EKG: EKG Interpretation Date/Time:  Friday February 15 2024 09:09:54 EST Ventricular Rate:  78 PR Interval:  151 QRS Duration:  89 QT Interval:  423 QTC Calculation: 482 R Axis:   90  Text Interpretation: Sinus rhythm Baseline wander in lead(s) III No significant change since last tracing Confirmed by Randol Simmonds [id number]) on 02/15/2024 9:13:45 AM  Radiology: [facility] Chest Port 1 View Result Date: 02/15/2024 CLINICAL DATA:  Dyspnea.  Wheezing.  Hypoxemia. EXAM: PORTABLE CHEST 1 VIEW COMPARISON:  05/31/2023 FINDINGS: Heart size remains at upper limits of normal. Both lungs are clear. No pneumothorax or pleural effusion visualized. IMPRESSION: No active disease. Electronically Signed   By: Norleen DELENA Kil M.D.   On: 02/15/2024 10:28     Procedures   Medications Ordered in the ED  albuterol  (VENTOLIN  HFA) 108 (90 Base) MCG/ACT inhaler 1-2 puff (has no administration in time range)  magnesium sulfate IVPB 2 g 50 mL (0 g Intravenous Stopped 02/15/24 1041)  albuterol  (PROVENTIL ) (2.5 MG/3ML) 0.083% nebulizer solution (10 mg/hr Nebulization Given 02/15/24 0930)    Clinical Course as of 02/15/24 1404  Fri Feb 15, 2024  1240 Resp panel by RT-PCR (RSV, Flu A&B, Covid) Anterior Nasal Swab COVID flu RSV  negative.  BNP normal.  Troponin normal.  hCG normal.  Venous blood gas without signs of acidosis. [JK]  1240 CBC(!) CBC shows stable anemia [JK]  1240 Comprehensive metabolic panel Normal [JK]  1325 Normal [JK]    Clinical Course User Index [JK] Randol Simmonds, MD  Medical Decision Making Problems Addressed: Exacerbation of intermittent asthma, unspecified asthma severity: acute illness or injury that poses a threat to life or bodily functions  Amount and/or Complexity of Data Reviewed Labs: ordered. Decision-making details documented in ED Course. Radiology: ordered and independent interpretation performed.  Risk Prescription drug management.   Patient presented to the ED with complaints of shortness of breath.  On arrival patient noted to have wheezing.  Patient was treated with breathing treatments.  She was also given Solu-Medrol by EMS.   Patient's breathing has improved.  She is not having any respiratory difficulty.  Laboratory tests are otherwise reassuring.  She has stable anemia but no other significant abnormalities.  No evidence of influenza CHF cardiac injury.  Chest x-ray does not show pneumonia.  Will discharge home with course of steroids and provide inhaler.  Warning signs precautions discussed    Final diagnoses:  Exacerbation of intermittent asthma, unspecified asthma severity    ED Discharge Orders          Ordered    predniSONE (DELTASONE) 50 MG tablet  Daily        02/15/24 1402    albuterol  (VENTOLIN  HFA) 108 (90 Base) MCG/ACT inhaler  Every 6 hours PRN        02/15/24 1402               Randol Simmonds, MD 02/15/24 1404  "

## 2024-02-15 NOTE — ED Notes (Signed)
Pt sleeping/ resp even and regular  

## 2024-02-18 ENCOUNTER — Other Ambulatory Visit (HOSPITAL_COMMUNITY): Payer: Self-pay

## 2024-02-25 ENCOUNTER — Other Ambulatory Visit: Payer: Self-pay

## 2024-02-25 ENCOUNTER — Inpatient Hospital Stay (HOSPITAL_COMMUNITY)
Admission: EM | Admit: 2024-02-25 | Discharge: 2024-02-26 | DRG: 189 | Disposition: A | Attending: Internal Medicine | Admitting: Internal Medicine

## 2024-02-25 ENCOUNTER — Emergency Department (HOSPITAL_COMMUNITY)

## 2024-02-25 ENCOUNTER — Encounter (HOSPITAL_COMMUNITY): Payer: Self-pay | Admitting: Emergency Medicine

## 2024-02-25 DIAGNOSIS — K219 Gastro-esophageal reflux disease without esophagitis: Secondary | ICD-10-CM | POA: Diagnosis present

## 2024-02-25 DIAGNOSIS — Z87891 Personal history of nicotine dependence: Secondary | ICD-10-CM

## 2024-02-25 DIAGNOSIS — E8729 Other acidosis: Secondary | ICD-10-CM | POA: Diagnosis present

## 2024-02-25 DIAGNOSIS — D75838 Other thrombocytosis: Secondary | ICD-10-CM | POA: Diagnosis present

## 2024-02-25 DIAGNOSIS — Z818 Family history of other mental and behavioral disorders: Secondary | ICD-10-CM

## 2024-02-25 DIAGNOSIS — Z5900 Homelessness unspecified: Secondary | ICD-10-CM

## 2024-02-25 DIAGNOSIS — F1410 Cocaine abuse, uncomplicated: Secondary | ICD-10-CM | POA: Diagnosis present

## 2024-02-25 DIAGNOSIS — J45901 Unspecified asthma with (acute) exacerbation: Secondary | ICD-10-CM | POA: Diagnosis not present

## 2024-02-25 DIAGNOSIS — F1310 Sedative, hypnotic or anxiolytic abuse, uncomplicated: Secondary | ICD-10-CM | POA: Diagnosis present

## 2024-02-25 DIAGNOSIS — E876 Hypokalemia: Secondary | ICD-10-CM | POA: Diagnosis present

## 2024-02-25 DIAGNOSIS — J4551 Severe persistent asthma with (acute) exacerbation: Principal | ICD-10-CM | POA: Diagnosis present

## 2024-02-25 DIAGNOSIS — J9601 Acute respiratory failure with hypoxia: Principal | ICD-10-CM | POA: Diagnosis present

## 2024-02-25 DIAGNOSIS — Z7951 Long term (current) use of inhaled steroids: Secondary | ICD-10-CM

## 2024-02-25 DIAGNOSIS — Z91199 Patient's noncompliance with other medical treatment and regimen due to unspecified reason: Secondary | ICD-10-CM

## 2024-02-25 DIAGNOSIS — Z1152 Encounter for screening for COVID-19: Secondary | ICD-10-CM

## 2024-02-25 DIAGNOSIS — D509 Iron deficiency anemia, unspecified: Secondary | ICD-10-CM | POA: Diagnosis present

## 2024-02-25 LAB — BASIC METABOLIC PANEL WITH GFR
Anion gap: 9 (ref 5–15)
Anion gap: 11 (ref 5–15)
BUN: 15 mg/dL (ref 6–20)
BUN: 14 mg/dL (ref 6–20)
CO2: 25 mmol/L (ref 22–32)
CO2: 25 mmol/L (ref 22–32)
Calcium: 8.6 mg/dL — ABNORMAL LOW (ref 8.9–10.3)
Calcium: 8.9 mg/dL (ref 8.9–10.3)
Chloride: 103 mmol/L (ref 98–111)
Chloride: 103 mmol/L (ref 98–111)
Creatinine, Ser: 0.7 mg/dL (ref 0.44–1.00)
Creatinine, Ser: 0.63 mg/dL (ref 0.44–1.00)
GFR, Estimated: >60 mL/min
GFR, Estimated: >60 mL/min
Glucose, Bld: 112 mg/dL — ABNORMAL HIGH (ref 70–99)
Glucose, Bld: 143 mg/dL — ABNORMAL HIGH (ref 70–99)
Potassium: 3.4 mmol/L — ABNORMAL LOW (ref 3.5–5.1)
Potassium: 3.7 mmol/L (ref 3.5–5.1)
Sodium: 138 mmol/L (ref 135–145)
Sodium: 139 mmol/L (ref 135–145)

## 2024-02-25 LAB — CBC WITH DIFFERENTIAL/PLATELET
Abs Immature Granulocytes: 0.02 10*3/uL (ref 0.00–0.07)
Basophils Absolute: 0 10*3/uL (ref 0.0–0.1)
Basophils Relative: 0 %
Eosinophils Absolute: 0.6 10*3/uL — ABNORMAL HIGH (ref 0.0–0.5)
Eosinophils Relative: 7 %
HCT: 30.2 % — ABNORMAL LOW (ref 36.0–46.0)
Hemoglobin: 8.7 g/dL — ABNORMAL LOW (ref 12.0–15.0)
Immature Granulocytes: 0 %
Lymphocytes Relative: 36 %
Lymphs Abs: 3.1 10*3/uL (ref 0.7–4.0)
MCH: 19.7 pg — ABNORMAL LOW (ref 26.0–34.0)
MCHC: 28.8 g/dL — ABNORMAL LOW (ref 30.0–36.0)
MCV: 68.5 fL — ABNORMAL LOW (ref 80.0–100.0)
Monocytes Absolute: 0.9 10*3/uL (ref 0.1–1.0)
Monocytes Relative: 10 %
Neutro Abs: 4 10*3/uL (ref 1.7–7.7)
Neutrophils Relative %: 47 %
Platelets: 481 10*3/uL — ABNORMAL HIGH (ref 150–400)
RBC: 4.41 MIL/uL (ref 3.87–5.11)
RDW: 16.7 % — ABNORMAL HIGH (ref 11.5–15.5)
WBC: 8.6 10*3/uL (ref 4.0–10.5)
nRBC: 0 % (ref 0.0–0.2)

## 2024-02-25 LAB — BLOOD GAS, ARTERIAL
Acid-Base Excess: 2.2 mmol/L — ABNORMAL HIGH (ref 0.0–2.0)
Bicarbonate: 28.9 mmol/L — ABNORMAL HIGH (ref 20.0–28.0)
Delivery systems: POSITIVE
Drawn by: 21338
Expiratory PAP: 5 cmH2O
FIO2: 40 %
Inspiratory PAP: 10 cmH2O
O2 Saturation: 100 %
Patient temperature: 36.4
RATE: 15 {breaths}/min
pCO2 arterial: 55 mmHg — ABNORMAL HIGH (ref 32–48)
pH, Arterial: 7.33 — ABNORMAL LOW (ref 7.35–7.45)
pO2, Arterial: 128 mmHg — ABNORMAL HIGH (ref 83–108)

## 2024-02-25 LAB — PRO BRAIN NATRIURETIC PEPTIDE: Pro Brain Natriuretic Peptide: <50 pg/mL

## 2024-02-25 LAB — MAGNESIUM: Magnesium: 2.5 mg/dL — ABNORMAL HIGH (ref 1.7–2.4)

## 2024-02-25 LAB — RESP PANEL BY RT-PCR (RSV, FLU A&B, COVID)  RVPGX2
Influenza A by PCR: NEGATIVE
Influenza B by PCR: NEGATIVE
Resp Syncytial Virus by PCR: NEGATIVE
SARS Coronavirus 2 by RT PCR: NEGATIVE

## 2024-02-25 LAB — PROCALCITONIN: Procalcitonin: <0.1 ng/mL

## 2024-02-25 MED ORDER — ACETAMINOPHEN 325 MG PO TABS
650.0000 mg | ORAL_TABLET | Freq: Four times a day (QID) | ORAL | Status: DC | PRN
  Administered 2024-02-26: 650 mg via ORAL
  Filled 2024-02-25 (×2): qty 2

## 2024-02-25 MED ORDER — BUDESONIDE 0.25 MG/2ML IN SUSP
0.2500 mg | Freq: Two times a day (BID) | RESPIRATORY_TRACT | Status: DC
  Administered 2024-02-25 – 2024-02-26 (×3): 0.25 mg via RESPIRATORY_TRACT
  Filled 2024-02-25 (×3): qty 2

## 2024-02-25 MED ORDER — HYDROCORTISONE 1 % EX CREA
1.0000 | TOPICAL_CREAM | Freq: Two times a day (BID) | CUTANEOUS | Status: DC
  Administered 2024-02-25 – 2024-02-26 (×2): 1 via TOPICAL
  Filled 2024-02-25: qty 28

## 2024-02-25 MED ORDER — IPRATROPIUM-ALBUTEROL 0.5-2.5 (3) MG/3ML IN SOLN
3.0000 mL | Freq: Four times a day (QID) | RESPIRATORY_TRACT | Status: DC
  Administered 2024-02-25 – 2024-02-26 (×5): 3 mL via RESPIRATORY_TRACT
  Filled 2024-02-25 (×5): qty 3

## 2024-02-25 MED ORDER — ALBUTEROL SULFATE (2.5 MG/3ML) 0.083% IN NEBU
10.0000 mg/h | INHALATION_SOLUTION | RESPIRATORY_TRACT | Status: DC
  Administered 2024-02-25: 10 mg/h via RESPIRATORY_TRACT
  Filled 2024-02-25 (×2): qty 12

## 2024-02-25 MED ORDER — ALBUTEROL SULFATE (2.5 MG/3ML) 0.083% IN NEBU: 10.0000 mg | INHALATION_SOLUTION | RESPIRATORY_TRACT | Status: AC

## 2024-02-25 MED ORDER — ENOXAPARIN SODIUM 40 MG/0.4ML IJ SOSY
40.0000 mg | PREFILLED_SYRINGE | INTRAMUSCULAR | Status: DC
  Administered 2024-02-25 – 2024-02-26 (×2): 40 mg via SUBCUTANEOUS
  Filled 2024-02-25 (×2): qty 0.4

## 2024-02-25 MED ORDER — CHLORHEXIDINE GLUCONATE CLOTH 2 % EX PADS
6.0000 | MEDICATED_PAD | Freq: Every day | CUTANEOUS | Status: DC
  Administered 2024-02-25: 6 via TOPICAL

## 2024-02-25 MED ORDER — METHYLPREDNISOLONE SODIUM SUCC 125 MG IJ SOLR
60.0000 mg | Freq: Two times a day (BID) | INTRAMUSCULAR | Status: DC
  Administered 2024-02-25 – 2024-02-26 (×3): 60 mg via INTRAVENOUS
  Filled 2024-02-25 (×3): qty 2

## 2024-02-25 MED ORDER — ALBUTEROL SULFATE (2.5 MG/3ML) 0.083% IN NEBU
2.5000 mg | INHALATION_SOLUTION | RESPIRATORY_TRACT | Status: DC | PRN
  Administered 2024-02-25: 2.5 mg via RESPIRATORY_TRACT
  Filled 2024-02-25: qty 3

## 2024-02-25 MED ORDER — POTASSIUM CHLORIDE 10 MEQ/100ML IV SOLN
10.0000 meq | INTRAVENOUS | Status: AC
  Administered 2024-02-25 (×2): 10 meq via INTRAVENOUS
  Filled 2024-02-25 (×2): qty 100

## 2024-02-25 NOTE — ED Triage Notes (Signed)
 Patient BIB EMS for evaluation of SHOB x 2 days.  Reports she is homeless and has been out in the cold.  Has albuterol  inhaler, but it is currently empty.  80% SpO2 on room air.  Given DuoNeb x 2, Albuterol  5 mg, Magnesium  2 grams IV, and Solu-Medrol  125 mg IV.  BP 154/76 HR 84

## 2024-02-25 NOTE — Progress Notes (Signed)
 Pt not on BIPAP pt doing well at this time.

## 2024-02-25 NOTE — ED Notes (Signed)
 PO2 less than 31, critical from lab. Medic and MD notified face to face.

## 2024-02-25 NOTE — Progress Notes (Signed)
" °   02/25/24 0503  BiPAP/CPAP/SIPAP  $ Non-Invasive Ventilator  Non-Invasive Vent Initial  $ Face Mask Medium Yes  BiPAP/CPAP/SIPAP Pt Type Adult  BiPAP/CPAP/SIPAP SERVO  Mask Type Full face mask  Dentures removed? Not applicable  Mask Size Medium  Set Rate 15 breaths/min  Respiratory Rate 23 breaths/min  IPAP 10 cmH20  EPAP 5 cmH2O  FiO2 (%) 40 %  Minute Ventilation 18.1  Leak 9  Peak Inspiratory Pressure (PIP) 10  Tidal Volume (Vt) 601  Patient Home Machine No  Patient Home Mask No  Patient Home Tubing No  Auto Titrate No  Press High Alarm 25 cmH2O  CPAP/SIPAP surface wiped down Yes  Device Plugged into RED Power Outlet Yes  BiPAP/CPAP /SiPAP Vitals  Pulse Rate 79  Resp (!) 23  SpO2 100 %  Bilateral Breath Sounds Expiratory wheezes  MEWS Score/Color  MEWS Score 1  MEWS Score Color Green   RT called to ED room 7 to place pt on Bipap per order. Pt vitals stable at this time. "

## 2024-02-26 ENCOUNTER — Other Ambulatory Visit (HOSPITAL_COMMUNITY): Payer: Self-pay

## 2024-02-26 DIAGNOSIS — J45901 Unspecified asthma with (acute) exacerbation: Secondary | ICD-10-CM | POA: Diagnosis not present

## 2024-02-26 LAB — CBC
HCT: 29.3 % — ABNORMAL LOW (ref 36.0–46.0)
Hemoglobin: 8.5 g/dL — ABNORMAL LOW (ref 12.0–15.0)
MCH: 19.4 pg — ABNORMAL LOW (ref 26.0–34.0)
MCHC: 29 g/dL — ABNORMAL LOW (ref 30.0–36.0)
MCV: 66.7 fL — ABNORMAL LOW (ref 80.0–100.0)
Platelets: 510 10*3/uL — ABNORMAL HIGH (ref 150–400)
RBC: 4.39 MIL/uL (ref 3.87–5.11)
RDW: 16 % — ABNORMAL HIGH (ref 11.5–15.5)
WBC: 6.9 10*3/uL (ref 4.0–10.5)
nRBC: 0 % (ref 0.0–0.2)

## 2024-02-26 LAB — COMPREHENSIVE METABOLIC PANEL WITH GFR
ALT: 20 U/L (ref 0–44)
AST: 27 U/L (ref 15–41)
Albumin: 3.4 g/dL — ABNORMAL LOW (ref 3.5–5.0)
Alkaline Phosphatase: 63 U/L (ref 38–126)
Anion gap: 8 (ref 5–15)
BUN: 13 mg/dL (ref 6–20)
CO2: 25 mmol/L (ref 22–32)
Calcium: 9.3 mg/dL (ref 8.9–10.3)
Chloride: 106 mmol/L (ref 98–111)
Creatinine, Ser: 0.73 mg/dL (ref 0.44–1.00)
GFR, Estimated: >60 mL/min
Glucose, Bld: 112 mg/dL — ABNORMAL HIGH (ref 70–99)
Potassium: 4 mmol/L (ref 3.5–5.1)
Sodium: 139 mmol/L (ref 135–145)
Total Bilirubin: 0.4 mg/dL (ref 0.0–1.2)
Total Protein: 7.2 g/dL (ref 6.5–8.1)

## 2024-02-26 LAB — BLOOD GAS, VENOUS
Acid-Base Excess: 2.7 mmol/L — ABNORMAL HIGH (ref 0.0–2.0)
Bicarbonate: 30 mmol/L — ABNORMAL HIGH (ref 20.0–28.0)
O2 Saturation: 39.7 %
Patient temperature: 37
pCO2, Ven: 61 mmHg — ABNORMAL HIGH (ref 44–60)
pH, Ven: 7.3 (ref 7.25–7.43)
pO2, Ven: <31 mmHg — CL (ref 32–45)

## 2024-02-26 LAB — MAGNESIUM: Magnesium: 2.3 mg/dL (ref 1.7–2.4)

## 2024-02-26 MED ORDER — ALBUTEROL SULFATE HFA 108 (90 BASE) MCG/ACT IN AERS
1.0000 | INHALATION_SPRAY | Freq: Four times a day (QID) | RESPIRATORY_TRACT | 0 refills | Status: AC | PRN
  Filled 2024-02-26: qty 6.7, 25d supply, fill #0

## 2024-02-26 MED ORDER — PREDNISONE 10 MG PO TABS
ORAL_TABLET | ORAL | 0 refills | Status: AC
  Filled 2024-02-26: qty 20, 8d supply, fill #0

## 2024-02-26 MED ORDER — FERROUS SULFATE 325 (65 FE) MG PO TBEC
325.0000 mg | DELAYED_RELEASE_TABLET | Freq: Two times a day (BID) | ORAL | 0 refills | Status: AC
  Filled 2024-02-26: qty 60, 30d supply, fill #0

## 2024-02-26 NOTE — Plan of Care (Signed)
  Problem: Health Behavior/Discharge Planning: Goal: Ability to manage health-related needs will improve Outcome: Progressing   Problem: Clinical Measurements: Goal: Respiratory complications will improve Outcome: Progressing   

## 2024-02-26 NOTE — TOC Transition Note (Addendum)
 Transition of Care [facility]) - Discharge Note   Patient Details  Name: Gail Weber MRN: [id number] Date of Birth: 1985-06-23  [facility] CM/SW Contact:  Alfonse JONELLE Rex, RN Phone Number: 02/26/2024, 10:50 AM   Clinical Narrative:   INPT CM consult for Substance Abuse Counseling and resources for homeless shelter, patient discharge prior to INPT assessment. Of note patient provided resources for -: food pantries, shelter list, transportation resources , housing resources , [facility] , Substance use resources during previous admission.           Patient Goals and CMS Choice            Discharge Placement                       Discharge Plan and Services Additional resources added to the After Visit Summary for                                       Social Drivers of Health (SDOH) Interventions SDOH Screenings   Food Insecurity: Patient Unable To Answer (05/31/2023)  Housing: Patient Unable To Answer (05/31/2023)  Transportation Needs: Patient Unable To Answer (04/24/2023)  Utilities: Patient Unable To Answer (04/24/2023)  Tobacco Use: High Risk (02/25/2024)     Readmission Risk Interventions     No data to display

## 2024-02-26 NOTE — Progress Notes (Signed)
 Discharge meds hand delivered to patient.  Chiquita LULLA Longs, RN
